# Patient Record
Sex: Female | Born: 1970 | Race: White | Hispanic: No | Marital: Married | Smoking: Former smoker
Health system: Southern US, Community
[De-identification: ages and names within clinical notes are randomized; demographics above are authoritative.]

## PROBLEM LIST (undated history)

## (undated) DIAGNOSIS — A048 Other specified bacterial intestinal infections: Secondary | ICD-10-CM

## (undated) DIAGNOSIS — M4800 Spinal stenosis, site unspecified: Secondary | ICD-10-CM

## (undated) DIAGNOSIS — J449 Chronic obstructive pulmonary disease, unspecified: Secondary | ICD-10-CM

## (undated) DIAGNOSIS — M5030 Other cervical disc degeneration, unspecified cervical region: Secondary | ICD-10-CM

## (undated) HISTORY — PX: TUBAL LIGATION: SHX77

---

## 1992-04-03 HISTORY — PX: TUBAL LIGATION: SHX77

## 2004-08-07 ENCOUNTER — Emergency Department: Payer: Self-pay | Admitting: Emergency Medicine

## 2005-05-12 ENCOUNTER — Emergency Department: Payer: Self-pay | Admitting: Internal Medicine

## 2007-04-11 ENCOUNTER — Emergency Department: Payer: Self-pay | Admitting: Emergency Medicine

## 2007-12-28 ENCOUNTER — Emergency Department: Payer: Self-pay | Admitting: Internal Medicine

## 2008-11-01 ENCOUNTER — Emergency Department: Payer: Self-pay | Admitting: Emergency Medicine

## 2010-02-22 ENCOUNTER — Emergency Department: Payer: Self-pay

## 2011-07-16 ENCOUNTER — Emergency Department: Payer: Self-pay | Admitting: Emergency Medicine

## 2011-07-17 ENCOUNTER — Emergency Department: Payer: Self-pay | Admitting: Emergency Medicine

## 2011-07-17 LAB — COMPREHENSIVE METABOLIC PANEL
Albumin: 4.2 g/dL (ref 3.4–5.0)
Anion Gap: 9 (ref 7–16)
Bilirubin,Total: 0.2 mg/dL (ref 0.2–1.0)
Calcium, Total: 9.4 mg/dL (ref 8.5–10.1)
Co2: 29 mmol/L (ref 21–32)
Creatinine: 0.79 mg/dL (ref 0.60–1.30)
EGFR (African American): >60
EGFR (Non-African Amer.): >60
Osmolality: 282 (ref 275–301)
SGOT(AST): 32 U/L (ref 15–37)
SGPT (ALT): 29 U/L
Total Protein: 7.6 g/dL (ref 6.4–8.2)

## 2011-07-17 LAB — URINALYSIS, COMPLETE
Bacteria: NONE SEEN
Bilirubin,UR: NEGATIVE
Glucose,UR: NEGATIVE mg/dL (ref 0–75)
Nitrite: NEGATIVE
Ph: 5 (ref 4.5–8.0)
Protein: NEGATIVE
Specific Gravity: 1.028 (ref 1.003–1.030)
Squamous Epithelial: 4
WBC UR: <1 /HPF (ref 0–5)

## 2011-07-17 LAB — CBC
HCT: 39.3 % (ref 35.0–47.0)
HGB: 12.8 g/dL (ref 12.0–16.0)
MCHC: 32.6 g/dL (ref 32.0–36.0)
MCV: 96 fL (ref 80–100)
Platelet: 292 10*3/uL (ref 150–440)
RBC: 4.09 10*6/uL (ref 3.80–5.20)

## 2011-11-21 ENCOUNTER — Emergency Department: Payer: Self-pay | Admitting: Emergency Medicine

## 2011-11-21 LAB — URINALYSIS, COMPLETE
Bilirubin,UR: NEGATIVE
Glucose,UR: NEGATIVE mg/dL (ref 0–75)
Granular Cast: 3
Hyaline Cast: 3
Ph: 5 (ref 4.5–8.0)
Squamous Epithelial: 15
Transitional Epi: <1
WBC UR: 9 /HPF (ref 0–5)

## 2011-11-21 LAB — COMPREHENSIVE METABOLIC PANEL
Alkaline Phosphatase: 58 U/L (ref 50–136)
BUN: 16 mg/dL (ref 7–18)
Bilirubin,Total: 0.3 mg/dL (ref 0.2–1.0)
Calcium, Total: 9 mg/dL (ref 8.5–10.1)
Chloride: 109 mmol/L — ABNORMAL HIGH (ref 98–107)
Co2: 25 mmol/L (ref 21–32)
EGFR (African American): >60
EGFR (Non-African Amer.): >60
Glucose: 100 mg/dL — ABNORMAL HIGH (ref 65–99)
Osmolality: 284 (ref 275–301)
Potassium: 3.5 mmol/L (ref 3.5–5.1)
SGPT (ALT): 20 U/L (ref 12–78)
Sodium: 142 mmol/L (ref 136–145)

## 2011-11-21 LAB — CBC
HGB: 13.6 g/dL (ref 12.0–16.0)
MCH: 32.3 pg (ref 26.0–34.0)
MCHC: 34 g/dL (ref 32.0–36.0)
RBC: 4.22 10*6/uL (ref 3.80–5.20)
WBC: 6.9 10*3/uL (ref 3.6–11.0)

## 2012-04-21 ENCOUNTER — Emergency Department: Payer: Self-pay | Admitting: Emergency Medicine

## 2013-09-07 ENCOUNTER — Emergency Department: Payer: Self-pay | Admitting: Emergency Medicine

## 2013-09-07 LAB — URINALYSIS, COMPLETE
Bilirubin,UR: NEGATIVE
GLUCOSE, UR: NEGATIVE mg/dL (ref 0–75)
Ketone: NEGATIVE
Leukocyte Esterase: NEGATIVE
NITRITE: NEGATIVE
PROTEIN: NEGATIVE
Ph: 6 (ref 4.5–8.0)
Specific Gravity: 1.024 (ref 1.003–1.030)
WBC UR: 3 /HPF (ref 0–5)

## 2013-09-07 LAB — CBC WITH DIFFERENTIAL/PLATELET
BASOS ABS: 0 10*3/uL (ref 0.0–0.1)
Basophil %: 0.4 %
Eosinophil #: 0.1 10*3/uL (ref 0.0–0.7)
Eosinophil %: 1.3 %
HCT: 39.4 % (ref 35.0–47.0)
HGB: 12.7 g/dL (ref 12.0–16.0)
LYMPHS ABS: 3.8 10*3/uL — AB (ref 1.0–3.6)
LYMPHS PCT: 40.6 %
MCH: 30.5 pg (ref 26.0–34.0)
MCHC: 32.2 g/dL (ref 32.0–36.0)
MCV: 95 fL (ref 80–100)
MONOS PCT: 7 %
Monocyte #: 0.7 x10 3/mm (ref 0.2–0.9)
NEUTROS ABS: 4.7 10*3/uL (ref 1.4–6.5)
NEUTROS PCT: 50.7 %
Platelet: 330 10*3/uL (ref 150–440)
RBC: 4.16 10*6/uL (ref 3.80–5.20)
RDW: 13.6 % (ref 11.5–14.5)
WBC: 9.4 10*3/uL (ref 3.6–11.0)

## 2013-09-07 LAB — LIPASE, BLOOD: LIPASE: 175 U/L (ref 73–393)

## 2013-09-08 LAB — COMPREHENSIVE METABOLIC PANEL
ALBUMIN: 3.7 g/dL (ref 3.4–5.0)
ALT: 24 U/L (ref 12–78)
Alkaline Phosphatase: 62 U/L
Anion Gap: 4 — ABNORMAL LOW (ref 7–16)
BUN: 16 mg/dL (ref 7–18)
Bilirubin,Total: 0.2 mg/dL (ref 0.2–1.0)
CHLORIDE: 108 mmol/L — AB (ref 98–107)
Calcium, Total: 9 mg/dL (ref 8.5–10.1)
Co2: 29 mmol/L (ref 21–32)
Creatinine: 1.01 mg/dL (ref 0.60–1.30)
GLUCOSE: 94 mg/dL (ref 65–99)
OSMOLALITY: 282 (ref 275–301)
Potassium: 3.9 mmol/L (ref 3.5–5.1)
SGOT(AST): 21 U/L (ref 15–37)
SODIUM: 141 mmol/L (ref 136–145)
Total Protein: 7.3 g/dL (ref 6.4–8.2)

## 2014-03-14 ENCOUNTER — Emergency Department: Payer: Self-pay | Admitting: Emergency Medicine

## 2014-07-29 ENCOUNTER — Emergency Department
Admit: 2014-07-29 | Disposition: A | Discharge status: Home / Self Care | Payer: Self-pay | Admitting: Emergency Medicine

## 2014-10-14 ENCOUNTER — Other Ambulatory Visit: Payer: Self-pay

## 2014-10-14 ENCOUNTER — Encounter: Payer: Self-pay | Admitting: Emergency Medicine

## 2014-10-14 ENCOUNTER — Emergency Department
Admission: EM | Admit: 2014-10-14 | Discharge: 2014-10-14 | Disposition: A | Discharge status: Home / Self Care | Payer: Self-pay | Attending: Emergency Medicine | Admitting: Emergency Medicine

## 2014-10-14 DIAGNOSIS — Z720 Tobacco use: Secondary | ICD-10-CM | POA: Insufficient documentation

## 2014-10-14 DIAGNOSIS — M5030 Other cervical disc degeneration, unspecified cervical region: Secondary | ICD-10-CM

## 2014-10-14 DIAGNOSIS — M5010 Cervical disc disorder with radiculopathy, unspecified cervical region: Secondary | ICD-10-CM | POA: Insufficient documentation

## 2014-10-14 DIAGNOSIS — Z880 Allergy status to penicillin: Secondary | ICD-10-CM | POA: Insufficient documentation

## 2014-10-14 DIAGNOSIS — M5412 Radiculopathy, cervical region: Secondary | ICD-10-CM

## 2014-10-14 HISTORY — DX: Spinal stenosis, site unspecified: M48.00

## 2014-10-14 LAB — BASIC METABOLIC PANEL
Anion gap: 8 (ref 5–15)
BUN: 17 mg/dL (ref 6–20)
CALCIUM: 9.1 mg/dL (ref 8.9–10.3)
CHLORIDE: 105 mmol/L (ref 101–111)
CO2: 26 mmol/L (ref 22–32)
CREATININE: 1.06 mg/dL — AB (ref 0.44–1.00)
GFR calc Af Amer: >60 mL/min (ref >60)
GLUCOSE: 82 mg/dL (ref 65–99)
POTASSIUM: 3.8 mmol/L (ref 3.5–5.1)
Sodium: 139 mmol/L (ref 135–145)

## 2014-10-14 LAB — CBC
HEMATOCRIT: 40.4 % (ref 35.0–47.0)
HEMOGLOBIN: 13.1 g/dL (ref 12.0–16.0)
MCH: 30.3 pg (ref 26.0–34.0)
MCHC: 32.5 g/dL (ref 32.0–36.0)
MCV: 93.3 fL (ref 80.0–100.0)
PLATELETS: 341 10*3/uL (ref 150–440)
RBC: 4.33 MIL/uL (ref 3.80–5.20)
RDW: 13.7 % (ref 11.5–14.5)
WBC: 7 10*3/uL (ref 3.6–11.0)

## 2014-10-14 LAB — TROPONIN I

## 2014-10-14 MED ORDER — PREDNISONE 10 MG PO TABS: ORAL_TABLET | ORAL | Status: DC

## 2014-10-14 NOTE — ED Provider Notes (Signed)
[facility] Emergency Department Provider Note  ____________________________________________  Time seen:  12:33 PM  I have reviewed the triage vital signs and the nursing notes.   HISTORY  Chief Complaint Numbness   HPI Hannah Shah is a 44 y.o. female is here today with complaint of left arm numbness for the last 7 days. She states that she has chronic neck issues and is aware that she has degenerative disc problems with her neck. She has not followed up with any specialist since being seen. She had x-rays done at [facility] several months ago and thought that they told her she had spinal stenosis. She states that present she does not have any pain medication. Currently her pain is a 9 out of 10.   Past Medical History  Diagnosis Date  . Spinal stenosis     There are no active problems to display for this patient.   Past Surgical History  Procedure Laterality Date  . Tubal ligation  1994    Current Outpatient Rx  Name  Route  Sig  Dispense  Refill  . predniSONE (DELTASONE) 10 MG tablet      Take 6 tablets  today, on day 2 take 5 tablets, day 3 take 4 tablets, day 4 take 3 tablets, day 5 take  2 tablets and 1 tablet the last day   21 tablet   0     Allergies Penicillins  No family history on file.  Social History History  Substance Use Topics  . Smoking status: Current Every Day Smoker -- 0.50 packs/day  . Smokeless tobacco: Not on file  . Alcohol Use: No    Review of Systems Constitutional: No fever/chills Eyes: No visual changes. ENT: No sore throat. Cardiovascular: Denies chest pain. Respiratory: Denies shortness of breath. Gastrointestinal: No abdominal pain.  No nausea, no vomiting.   Genitourinary: Negative for dysuria. Musculoskeletal: Negative for back pain. Skin: Negative for rash. Neurological: Negative for headaches, focal weakness. Positive numbness left arm.  10-point ROS otherwise  negative.  ____________________________________________   PHYSICAL EXAM:  VITAL SIGNS: ED Triage Vitals  Enc Vitals Group     BP 10/14/14 1052 114/68 mmHg     Pulse Rate 10/14/14 1052 102     Resp 10/14/14 1052 18     Temp 10/14/14 1052 98.3 F (36.8 C)     Temp Source 10/14/14 1052 Oral     SpO2 10/14/14 1052 98 %     Weight 10/14/14 1047 240 lb (108.863 kg)     Height 10/14/14 1047  (1.626 m)     Head Cir --      Peak Flow --      Pain Score 10/14/14 1047 9     Pain Loc --      Pain Edu? --      Excl. in GC? --     Constitutional: Alert and oriented. Well appearing and in no acute distress. Eyes: Conjunctivae are normal. PERRL. EOMI. Head: Atraumatic. Nose: No congestion/rhinnorhea. Neck: No stridor.  Tenderness on palpation of cervical spine and paravertebral muscles. Range of motion is restricted secondary to pain. No gross deformity was noted. Hematological/Lymphatic/Immunilogical: No cervical lymphadenopathy. Cardiovascular: Normal rate, regular rhythm. Grossly normal heart sounds.  Good peripheral circulation. Respiratory: Normal respiratory effort.  No retractions. Lungs CTAB. Gastrointestinal: Soft and nontender. No distention. Musculoskeletal: Left arm no gross deformity. Range of motion is within normal limits. Grip strength is slightly decreased in relationship to the right. Motor sensory function  intact. No lower extremity tenderness nor edema.  No joint effusions. Neurologic:  Normal speech and language. No gross focal neurologic deficits are appreciated. No gait instability. Skin:  Skin is warm, dry and intact. No rash noted. Psychiatric: Mood and affect are normal. Speech and behavior are normal.  ____________________________________________   LABS (all labs ordered are listed, but only abnormal results are displayed)  Labs Reviewed  BASIC METABOLIC PANEL - Abnormal; Notable for the following:    Creatinine, Ser 1.06 (*)    All other components  within normal limits  CBC  TROPONIN I     PROCEDURES  Procedure(s) performed: None  Critical Care performed: No  ____________________________________________   INITIAL IMPRESSION / ASSESSMENT AND PLAN / ED COURSE  Pertinent labs & imaging results that were available during my care of the patient were reviewed by me and considered in my medical decision making (see chart for details).  Patient was started on prednisone 60 a taper. She is to follow-up with Dr. Rosita KeaMenz for chronic neck pain with radiculopathy ____________________________________________   FINAL CLINICAL IMPRESSION(S) / ED DIAGNOSES  Final diagnoses:  DDD (degenerative disc disease), cervical  Chronic cervical radiculopathy      Tommi RumpsRhonda L Joud Pettinato, PA-C 10/14/14 1618  Sharyn CreamerMark Quale, MD 10/19/14 [phone]

## 2014-10-14 NOTE — ED Notes (Signed)
Developed pain and numbness to left side of chest and left arm about 7 days ago. Now having some pain in neck.denies nay injury

## 2014-10-14 NOTE — ED Notes (Signed)
States she has a hx of spinal stenosis and has had weakness /numbness in the past.

## 2014-10-14 NOTE — ED Provider Notes (Signed)
ED ECG REPORT I, QUALE, MARK, the attending physician, personally viewed and interpreted this ECG.  Date: 10/14/2014 EKG Time: 10:55 AM Rate: 103 Rhythm: Sinus tachycardia QRS Axis: normal Intervals: normal ST/T Wave abnormalities: normal Conduction Disutrbances: none Narrative Interpretation: Sinus tachycardia, no ischemic abnormalities.   Sharyn CreamerMark Quale, MD 10/14/14 [phone]

## 2014-12-25 ENCOUNTER — Emergency Department: Payer: Self-pay

## 2014-12-25 ENCOUNTER — Encounter: Payer: Self-pay | Admitting: Radiology

## 2014-12-25 ENCOUNTER — Emergency Department
Admission: EM | Admit: 2014-12-25 | Discharge: 2014-12-25 | Disposition: A | Discharge status: Home / Self Care | Payer: Self-pay | Attending: Emergency Medicine | Admitting: Emergency Medicine

## 2014-12-25 DIAGNOSIS — Z880 Allergy status to penicillin: Secondary | ICD-10-CM | POA: Insufficient documentation

## 2014-12-25 DIAGNOSIS — Z7952 Long term (current) use of systemic steroids: Secondary | ICD-10-CM | POA: Insufficient documentation

## 2014-12-25 DIAGNOSIS — R112 Nausea with vomiting, unspecified: Secondary | ICD-10-CM | POA: Insufficient documentation

## 2014-12-25 DIAGNOSIS — Z720 Tobacco use: Secondary | ICD-10-CM | POA: Insufficient documentation

## 2014-12-25 DIAGNOSIS — R109 Unspecified abdominal pain: Secondary | ICD-10-CM | POA: Insufficient documentation

## 2014-12-25 LAB — COMPREHENSIVE METABOLIC PANEL
ALK PHOS: 57 U/L (ref 38–126)
ALT: 21 U/L (ref 14–54)
ANION GAP: 3 — AB (ref 5–15)
AST: 24 U/L (ref 15–41)
Albumin: 4.2 g/dL (ref 3.5–5.0)
BUN: 12 mg/dL (ref 6–20)
CALCIUM: 9.2 mg/dL (ref 8.9–10.3)
CHLORIDE: 107 mmol/L (ref 101–111)
CO2: 30 mmol/L (ref 22–32)
Creatinine, Ser: 0.99 mg/dL (ref 0.44–1.00)
GFR calc non Af Amer: >60 mL/min (ref >60)
Glucose, Bld: 105 mg/dL — ABNORMAL HIGH (ref 65–99)
Potassium: 4 mmol/L (ref 3.5–5.1)
SODIUM: 140 mmol/L (ref 135–145)
Total Bilirubin: 0.6 mg/dL (ref 0.3–1.2)
Total Protein: 7.2 g/dL (ref 6.5–8.1)

## 2014-12-25 LAB — URINALYSIS COMPLETE WITH MICROSCOPIC (ARMC ONLY)
Bilirubin Urine: NEGATIVE
Glucose, UA: NEGATIVE mg/dL
Ketones, ur: NEGATIVE mg/dL
Leukocytes, UA: NEGATIVE
Nitrite: NEGATIVE
PH: 8 (ref 5.0–8.0)
PROTEIN: NEGATIVE mg/dL
SPECIFIC GRAVITY, URINE: 1.016 (ref 1.005–1.030)

## 2014-12-25 LAB — CBC
HCT: 41.8 % (ref 35.0–47.0)
HEMOGLOBIN: 13.7 g/dL (ref 12.0–16.0)
MCH: 30.3 pg (ref 26.0–34.0)
MCHC: 32.8 g/dL (ref 32.0–36.0)
MCV: 92.3 fL (ref 80.0–100.0)
Platelets: 316 10*3/uL (ref 150–440)
RBC: 4.53 MIL/uL (ref 3.80–5.20)
RDW: 14.2 % (ref 11.5–14.5)
WBC: 7.3 10*3/uL (ref 3.6–11.0)

## 2014-12-25 LAB — LIPASE, BLOOD: LIPASE: 29 U/L (ref 22–51)

## 2014-12-25 MED ORDER — SODIUM CHLORIDE 0.9 % IV BOLUS (SEPSIS)
1000.0000 mL | Freq: Once | INTRAVENOUS | Status: AC
  Administered 2014-12-25: 1000 mL via INTRAVENOUS

## 2014-12-25 MED ORDER — GI COCKTAIL ~~LOC~~
30.0000 mL | Freq: Once | ORAL | Status: AC
  Administered 2014-12-25: 30 mL via ORAL
  Filled 2014-12-25: qty 30

## 2014-12-25 MED ORDER — PROMETHAZINE HCL 12.5 MG PO TABS: 12.5000 mg | ORAL_TABLET | Freq: Four times a day (QID) | ORAL | Status: DC | PRN

## 2014-12-25 MED ORDER — IOHEXOL 300 MG/ML  SOLN
100.0000 mL | Freq: Once | INTRAMUSCULAR | Status: AC | PRN
  Administered 2014-12-25: 100 mL via INTRAVENOUS

## 2014-12-25 MED ORDER — ONDANSETRON HCL 4 MG/2ML IJ SOLN
4.0000 mg | Freq: Once | INTRAMUSCULAR | Status: AC
  Administered 2014-12-25: 4 mg via INTRAVENOUS
  Filled 2014-12-25: qty 2

## 2014-12-25 MED ORDER — ONDANSETRON HCL 4 MG/2ML IJ SOLN
4.0000 mg | Freq: Once | INTRAMUSCULAR | Status: AC
  Administered 2014-12-25: 4 mg via INTRAVENOUS

## 2014-12-25 MED ORDER — ONDANSETRON HCL 4 MG/2ML IJ SOLN
INTRAMUSCULAR | Status: AC
  Administered 2014-12-25: 4 mg via INTRAVENOUS
  Filled 2014-12-25: qty 2

## 2014-12-25 MED ORDER — PROMETHAZINE HCL 25 MG/ML IJ SOLN
INTRAMUSCULAR | Status: AC
  Administered 2014-12-25: 25 mg via INTRAVENOUS
  Filled 2014-12-25: qty 1

## 2014-12-25 MED ORDER — PROMETHAZINE HCL 25 MG/ML IJ SOLN
25.0000 mg | Freq: Once | INTRAMUSCULAR | Status: AC
  Administered 2014-12-25: 25 mg via INTRAVENOUS

## 2014-12-25 MED ORDER — IOHEXOL 240 MG/ML SOLN
25.0000 mL | Freq: Once | INTRAMUSCULAR | Status: DC | PRN
  Administered 2014-12-25: 25 mL via INTRAVENOUS
  Filled 2014-12-25: qty 50

## 2014-12-25 MED ORDER — PROMETHAZINE HCL 25 MG RE SUPP: 25.0000 mg | Freq: Four times a day (QID) | RECTAL | Status: DC | PRN

## 2014-12-25 MED ORDER — MORPHINE SULFATE (PF) 4 MG/ML IV SOLN
4.0000 mg | Freq: Once | INTRAVENOUS | Status: AC
  Administered 2014-12-25: 4 mg via INTRAVENOUS
  Filled 2014-12-25: qty 1

## 2014-12-25 NOTE — Discharge Instructions (Signed)
Please seek medical attention for any high fevers, chest pain, shortness of breath, change in behavior, persistent vomiting, bloody stool or any other new or concerning symptoms. ° ° °Nausea and Vomiting °Nausea is a sick feeling that often comes before throwing up (vomiting). Vomiting is a reflex where stomach contents come out of your mouth. Vomiting can cause severe loss of body fluids (dehydration). Children and elderly adults can become dehydrated quickly, especially if they also have diarrhea. Nausea and vomiting are symptoms of a condition or disease. It is important to find the cause of your symptoms. °CAUSES  °· Direct irritation of the stomach lining. This irritation can result from increased acid production (gastroesophageal reflux disease), infection, food poisoning, taking certain medicines (such as nonsteroidal anti-inflammatory drugs), alcohol use, or tobacco use. °· Signals from the brain. These signals could be caused by a headache, heat exposure, an inner ear disturbance, increased pressure in the brain from injury, infection, a tumor, or a concussion, pain, emotional stimulus, or metabolic problems. °· An obstruction in the gastrointestinal tract (bowel obstruction). °· Illnesses such as diabetes, hepatitis, gallbladder problems, appendicitis, kidney problems, cancer, sepsis, atypical symptoms of a heart attack, or eating disorders. °· Medical treatments such as chemotherapy and radiation. °· Receiving medicine that makes you sleep (general anesthetic) during surgery. °DIAGNOSIS °Your caregiver may ask for tests to be done if the problems do not improve after a few days. Tests may also be done if symptoms are severe or if the reason for the nausea and vomiting is not clear. Tests may include: °· Urine tests. °· Blood tests. °· Stool tests. °· Cultures (to look for evidence of infection). °· X-rays or other imaging studies. °Test results can help your caregiver make decisions about treatment or the  need for additional tests. °TREATMENT °You need to stay well hydrated. Drink frequently but in small amounts. You may wish to drink water, sports drinks, clear broth, or eat frozen ice pops or gelatin dessert to help stay hydrated. When you eat, eating slowly may help prevent nausea. There are also some antinausea medicines that may help prevent nausea. °HOME CARE INSTRUCTIONS  °· Take all medicine as directed by your caregiver. °· If you do not have an appetite, do not force yourself to eat. However, you must continue to drink fluids. °· If you have an appetite, eat a normal diet unless your caregiver tells you differently. °¨ Eat a variety of complex carbohydrates (rice, wheat, potatoes, bread), lean meats, yogurt, fruits, and vegetables. °¨ Avoid high-fat foods because they are more difficult to digest. °· Drink enough water and fluids to keep your urine clear or pale yellow. °· If you are dehydrated, ask your caregiver for specific rehydration instructions. Signs of dehydration may include: °¨ Severe thirst. °¨ Dry lips and mouth. °¨ Dizziness. °¨ Dark urine. °¨ Decreasing urine frequency and amount. °¨ Confusion. °¨ Rapid breathing or pulse. °SEEK IMMEDIATE MEDICAL CARE IF:  °· You have blood or brown flecks (like coffee grounds) in your vomit. °· You have black or bloody stools. °· You have a severe headache or stiff neck. °· You are confused. °· You have severe abdominal pain. °· You have chest pain or trouble breathing. °· You do not urinate at least once every 8 hours. °· You develop cold or clammy skin. °· You continue to vomit for longer than 24 to 48 hours. °· You have a fever. °MAKE SURE YOU:  °· Understand these instructions. °· Will watch your condition. °· Will get help right away   if you are not doing well or get worse. °Document Released: 03/20/2005 Document Revised: 06/12/2011 Document Reviewed: 08/17/2010 °[employer]® Patient Information ©2015 [employer]. This information is not intended to  replace advice given to you by your health care provider. Make sure you discuss any questions you have with your health care provider. ° °

## 2014-12-25 NOTE — ED Notes (Signed)
Pt complaining of vomiting x2 weeks, LLQ and LUQ fluttering feeling to abd , LMP July , premenopausal, increased frequency of urination with no change in oral intake.

## 2014-12-25 NOTE — ED Notes (Signed)
Vomiting for the past 2 weeks, pain in between her shoulders and more on the left and lower abd pain, pt also states that she has a tingling numbness to bilat hands and feet.

## 2014-12-25 NOTE — ED Provider Notes (Signed)
[facility] Emergency Department Provider Note    ____________________________________________  Time seen: 1700  I have reviewed the triage vital signs and the nursing notes.   HISTORY  Chief Complaint Emesis   History limited by: Not Limited   HPI Hannah Shah is a 44 y.o. female who presents to the emergency department today with nausea vomiting and left-sided abdominal pain. The patient states the symptoms have been going on for 2 weeks. She states that they have been constant. She describes the pain as being moderate to severe. It is located in the left side of the abdomen. She has had associated nausea and vomiting. She states she has been unable to keep down anything by mouth. She denies having similar symptoms in the past. She has not had any fevers.  Past Medical History  Diagnosis Date  . Spinal stenosis     There are no active problems to display for this patient.   Past Surgical History  Procedure Laterality Date  . Tubal ligation  1994    Current Outpatient Rx  Name  Route  Sig  Dispense  Refill  . predniSONE (DELTASONE) 10 MG tablet      Take 6 tablets  today, on day 2 take 5 tablets, day 3 take 4 tablets, day 4 take 3 tablets, day 5 take  2 tablets and 1 tablet the last day   21 tablet   0     Allergies Penicillins  No family history on file.  Social History Social History  Substance Use Topics  . Smoking status: Current Every Day Smoker -- 0.50 packs/day  . Smokeless tobacco: Not on file  . Alcohol Use: No    Review of Systems  Constitutional: Negative for fever. Cardiovascular: Negative for chest pain. Respiratory: Negative for shortness of breath. Gastrointestinal: Positive for left-sided abdominal pain and vomiting Genitourinary: Negative for dysuria. Musculoskeletal: Negative for back pain. Skin: Negative for rash. Neurological: Negative for headaches, focal weakness or numbness.  10-point ROS  otherwise negative.  ____________________________________________   PHYSICAL EXAM:  VITAL SIGNS: ED Triage Vitals  Enc Vitals Group     BP 12/25/14 1520 123/66 mmHg     Pulse Rate 12/25/14 1229 83     Resp 12/25/14 1229 18     Temp 12/25/14 1229 98.7 F (37.1 C)     Temp Source 12/25/14 1229 Oral     SpO2 12/25/14 1229 98 %     Weight 12/25/14 1229 240 lb (108.863 kg)     Height 12/25/14 1229  (1.6 m)   Constitutional: Alert and oriented. Appears uncomfortable, nauseous Eyes: Conjunctivae are normal. PERRL. Normal extraocular movements. ENT   Head: Normocephalic and atraumatic.   Nose: No congestion/rhinnorhea.   Mouth/Throat: Mucous membranes are moist.   Neck: No stridor. Hematological/Lymphatic/Immunilogical: No cervical lymphadenopathy. Cardiovascular: Normal rate, regular rhythm.  No murmurs, rubs, or gallops. Respiratory: Normal respiratory effort without tachypnea nor retractions. Breath sounds are clear and equal bilaterally. No wheezes/rales/rhonchi. Gastrointestinal: Soft and minimally tender to the left side. No rebound. No guarding. No distention. Mild left-sided CVA tenderness Genitourinary: Deferred Musculoskeletal: Normal range of motion in all extremities. No joint effusions.  No lower extremity tenderness nor edema. Neurologic:  Normal speech and language. No gross focal neurologic deficits are appreciated. Speech is normal.  Skin:  Skin is warm, dry and intact. No rash noted. Psychiatric: Mood and affect are normal. Speech and behavior are normal. Patient exhibits appropriate insight and judgment.  ____________________________________________  LABS (pertinent positives/negatives)  Labs Reviewed  COMPREHENSIVE METABOLIC PANEL - Abnormal; Notable for the following:    Glucose, Bld 105 (*)    Anion gap 3 (*)    All other components within normal limits  URINALYSIS COMPLETEWITH MICROSCOPIC ([facility] ONLY) - Abnormal; Notable for the  following:    Color, Urine YELLOW (*)    APPearance CLEAR (*)    Hgb urine dipstick 1+ (*)    Bacteria, UA RARE (*)    Squamous Epithelial / LPF 0-5 (*)    All other components within normal limits  LIPASE, BLOOD  CBC  POC URINE PREG, ED     ____________________________________________   EKG  None  ____________________________________________    RADIOLOGY  Ct abd/pel  IMPRESSION: Normal study.  ____________________________________________   PROCEDURES  Procedure(s) performed: None  Critical Care performed: No  ____________________________________________   INITIAL IMPRESSION / ASSESSMENT AND PLAN / ED COURSE  Pertinent labs & imaging results that were available during my care of the patient were reviewed by me and considered in my medical decision making (see chart for details).  Patient presented to the emergency department today because of concerns for left-sided abdominal pain nausea and vomiting for 2 weeks. On exam patient did have some mild left-sided tenderness. Blood work without any concerning findings. Given the tenderness I did have concerns for possible diverticulitis, pyelonephritis and a CT scan was ordered. CT scan without any obvious etiology. Patient did feel better after Phenergan. Discussed with patient that CT scan blood work and urine without any obvious cause of the pain. Advised patient follow-up with GI.  ____________________________________________   FINAL CLINICAL IMPRESSION(S) / ED DIAGNOSES  Final diagnoses:  Nausea and vomiting, vomiting of unspecified type     Phineas Semen, MD 12/25/14 2114

## 2015-03-01 ENCOUNTER — Encounter: Payer: Self-pay | Admitting: Emergency Medicine

## 2015-03-01 ENCOUNTER — Emergency Department: Payer: Self-pay

## 2015-03-01 ENCOUNTER — Emergency Department
Admission: EM | Admit: 2015-03-01 | Discharge: 2015-03-01 | Disposition: A | Discharge status: Home / Self Care | Payer: Self-pay | Attending: Emergency Medicine | Admitting: Emergency Medicine

## 2015-03-01 DIAGNOSIS — F17200 Nicotine dependence, unspecified, uncomplicated: Secondary | ICD-10-CM | POA: Insufficient documentation

## 2015-03-01 DIAGNOSIS — Z7952 Long term (current) use of systemic steroids: Secondary | ICD-10-CM | POA: Insufficient documentation

## 2015-03-01 DIAGNOSIS — Z880 Allergy status to penicillin: Secondary | ICD-10-CM | POA: Insufficient documentation

## 2015-03-01 DIAGNOSIS — R202 Paresthesia of skin: Secondary | ICD-10-CM | POA: Insufficient documentation

## 2015-03-01 DIAGNOSIS — M47892 Other spondylosis, cervical region: Secondary | ICD-10-CM | POA: Insufficient documentation

## 2015-03-01 DIAGNOSIS — M47812 Spondylosis without myelopathy or radiculopathy, cervical region: Secondary | ICD-10-CM

## 2015-03-01 DIAGNOSIS — J441 Chronic obstructive pulmonary disease with (acute) exacerbation: Secondary | ICD-10-CM | POA: Insufficient documentation

## 2015-03-01 HISTORY — DX: Other cervical disc degeneration, unspecified cervical region: M50.30

## 2015-03-01 HISTORY — DX: Chronic obstructive pulmonary disease, unspecified: J44.9

## 2015-03-01 MED ORDER — AZITHROMYCIN 250 MG PO TABS: ORAL_TABLET | ORAL | Status: DC

## 2015-03-01 MED ORDER — GUAIFENESIN-CODEINE 100-10 MG/5ML PO SOLN: 10.0000 mL | ORAL | Status: DC | PRN

## 2015-03-01 MED ORDER — MELOXICAM 15 MG PO TABS: 15.0000 mg | ORAL_TABLET | Freq: Every day | ORAL | Status: DC

## 2015-03-01 NOTE — Discharge Instructions (Signed)

## 2015-03-01 NOTE — ED Provider Notes (Signed)
[facility] Emergency Department Provider Note  ____________________________________________  Time seen: Approximately 11:57 AM  I have reviewed the triage vital signs and the nursing notes.   HISTORY  Chief Complaint Numbness  HPI Hannah Shah is a 44 y.o. female who presents for evaluation of numbness to both hands upon awakening for the last 3-4 days. Past medical history significant for degenerative disc disease. In addition patient reports having a cough for about a weeks with some discomfort in her chest and back.   Past Medical History  Diagnosis Date  . Spinal stenosis   . DDD (degenerative disc disease), cervical   . COPD (chronic obstructive pulmonary disease) (HCC)     There are no active problems to display for this patient.   Past Surgical History  Procedure Laterality Date  . Tubal ligation  1994    Current Outpatient Rx  Name  Route  Sig  Dispense  Refill  . azithromycin (ZITHROMAX Z-PAK) 250 MG tablet      Take 2 tablets (500 mg) on  Day 1,  followed by 1 tablet (250 mg) once daily on Days 2 through 5.   6 each   0   . guaiFENesin-codeine 100-10 MG/5ML syrup   Oral   Take 10 mLs by mouth every 4 (four) hours as needed for cough.   180 mL   0   . meloxicam (MOBIC) 15 MG tablet   Oral   Take 1 tablet (15 mg total) by mouth daily.   30 tablet   0   . predniSONE (DELTASONE) 10 MG tablet      Take 6 tablets  today, on day 2 take 5 tablets, day 3 take 4 tablets, day 4 take 3 tablets, day 5 take  2 tablets and 1 tablet the last day   21 tablet   0   . promethazine (PHENERGAN) 12.5 MG tablet   Oral   Take 1 tablet (12.5 mg total) by mouth every 6 (six) hours as needed for nausea or vomiting.   30 tablet   0   . promethazine (PHENERGAN) 25 MG suppository   Rectal   Place 1 suppository (25 mg total) rectally every 6 (six) hours as needed for nausea.   12 suppository   1     Allergies Penicillins  No family  history on file.  Social History Social History  Substance Use Topics  . Smoking status: Current Every Day Smoker -- 0.50 packs/day  . Smokeless tobacco: None  . Alcohol Use: No    Review of Systems Constitutional: No fever/chills Eyes: No visual changes. ENT: No sore throat. Cardiovascular: Denies chest pain. Respiratory: Denies shortness of breath. Positive for cough and chest congestion. Gastrointestinal: No abdominal pain.  No nausea, no vomiting.  No diarrhea.  No constipation. Genitourinary: Negative for dysuria. Musculoskeletal: Negative for back pain. Positive for lateral hand numbness and tingling. Skin: Negative for rash. Neurological: Positive for bilateral hand numbness and tingling.  10-point ROS otherwise negative.  ____________________________________________   PHYSICAL EXAM:  VITAL SIGNS: ED Triage Vitals  Enc Vitals Group     BP 03/01/15 1118 131/83 mmHg     Pulse Rate 03/01/15 1118 77     Resp 03/01/15 1118 20     Temp 03/01/15 1118 97.8 F (36.6 C)     Temp Source 03/01/15 1118 Oral     SpO2 03/01/15 1118 100 %     Weight 03/01/15 1118 235 lb (106.595 kg)  Height 03/01/15 1118 5\' 4"  (1.626 m)     Head Cir --      Peak Flow --      Pain Score 03/01/15 1119 8     Pain Loc --      Pain Edu? --      Excl. in GC? --     Constitutional: Alert and oriented. Well appearing and in no acute distress. Eyes: Conjunctivae are normal. PERRL. EOMI. Head: Atraumatic. Nose: No congestion/rhinnorhea. Mouth/Throat: Mucous membranes are moist.  Oropharynx non-erythematous. Neck: No stridor.  No cervical spinal tenderness to palpation Cardiovascular: Normal rate, regular rhythm. Grossly normal heart sounds.  Good peripheral circulation. Respiratory: Normal respiratory effort.  No retractions. Lungs CTAB. Gastrointestinal: Soft and nontender. No distention. No abdominal bruits. No CVA tenderness. Musculoskeletal: No lower extremity tenderness nor edema.  No  joint effusions. Neurologic:  Normal speech and language. No gross focal neurologic deficits are appreciated. No gait instability. Tinel's negative bilaterally. Decreased strength noted bilaterally. Full range of motion both shoulders. Skin:  Skin is warm, dry and intact. No rash noted. Psychiatric: Mood and affect are normal. Speech and behavior are normal.  ____________________________________________   LABS (all labs ordered are listed, but only abnormal results are displayed)  Labs Reviewed - No data to display ____________________________________________  RADIOLOGY  Chest x-ray negative  FINDINGS: There is straightening of the normal cervical lordosis without subluxation or fracture. Uncovertebral hypertrophy in the mid cervical spine. Mild endplate degenerative changes, loss of disc space height and mild left neural foraminal narrowing at C4-5. Visualized lung apices are clear. Soft tissues are unremarkable.  IMPRESSION: Straightening of the normal cervical lordosis with mild degenerative disc disease, worst at C4-5. ____________________________________________   PROCEDURES  Procedure(s) performed: None  Critical Care performed: No  ____________________________________________   INITIAL IMPRESSION / ASSESSMENT AND PLAN / ED COURSE  Pertinent labs & imaging results that were available during my care of the patient were reviewed by me and considered in my medical decision making (see chart for details).  Acute exacerbation of COPD. Degenerative disc disease of the cervical spine. Reassurance provided Rx given for Z-Pak, Robitussin-AC, and meloxicam 15 mg daily. Patient follow-up or establish a local provider for continued care. ____________________________________________   FINAL CLINICAL IMPRESSION(S) / ED DIAGNOSES  Final diagnoses:  COPD exacerbation (HCC)  DJD (degenerative joint disease) of cervical spine      Evangeline DakinCharles M Beers, PA-C 03/01/15  1541  Myrna Blazeravid Matthew Schaevitz, MD 03/01/15 [phone]

## 2015-03-01 NOTE — ED Notes (Addendum)
States she has numbness to hands upon awaking for the past 3-4 days. Hx of degenerative disc  Also cough for about 1 week with some discomfort in chest and mid back on the right

## 2015-04-26 ENCOUNTER — Emergency Department
Admission: EM | Admit: 2015-04-26 | Discharge: 2015-04-26 | Disposition: A | Discharge status: Home / Self Care | Payer: Self-pay | Attending: Emergency Medicine | Admitting: Emergency Medicine

## 2015-04-26 ENCOUNTER — Encounter: Payer: Self-pay | Admitting: Emergency Medicine

## 2015-04-26 DIAGNOSIS — J449 Chronic obstructive pulmonary disease, unspecified: Secondary | ICD-10-CM | POA: Insufficient documentation

## 2015-04-26 DIAGNOSIS — H65111 Acute and subacute allergic otitis media (mucoid) (sanguinous) (serous), right ear: Secondary | ICD-10-CM

## 2015-04-26 DIAGNOSIS — H6591 Unspecified nonsuppurative otitis media, right ear: Secondary | ICD-10-CM | POA: Insufficient documentation

## 2015-04-26 DIAGNOSIS — Z880 Allergy status to penicillin: Secondary | ICD-10-CM | POA: Insufficient documentation

## 2015-04-26 DIAGNOSIS — Z79899 Other long term (current) drug therapy: Secondary | ICD-10-CM | POA: Insufficient documentation

## 2015-04-26 DIAGNOSIS — H6091 Unspecified otitis externa, right ear: Secondary | ICD-10-CM | POA: Insufficient documentation

## 2015-04-26 DIAGNOSIS — J0100 Acute maxillary sinusitis, unspecified: Secondary | ICD-10-CM | POA: Insufficient documentation

## 2015-04-26 DIAGNOSIS — Z791 Long term (current) use of non-steroidal anti-inflammatories (NSAID): Secondary | ICD-10-CM | POA: Insufficient documentation

## 2015-04-26 DIAGNOSIS — F17200 Nicotine dependence, unspecified, uncomplicated: Secondary | ICD-10-CM | POA: Insufficient documentation

## 2015-04-26 MED ORDER — DOXYCYCLINE HYCLATE 100 MG PO TABS
100.0000 mg | ORAL_TABLET | Freq: Two times a day (BID) | ORAL | Status: DC
Start: 2015-04-26 — End: 2016-06-17

## 2015-04-26 MED ORDER — FLUTICASONE PROPIONATE 50 MCG/ACT NA SUSP: 1.0000 | Freq: Two times a day (BID) | NASAL | Status: DC

## 2015-04-26 MED ORDER — NEOMYCIN-POLYMYXIN-HC 3.5-10000-1 OT SOLN: 3.0000 [drp] | Freq: Three times a day (TID) | OTIC | Status: AC

## 2015-04-26 MED ORDER — CETIRIZINE HCL 10 MG PO TABS: 10.0000 mg | ORAL_TABLET | Freq: Every day | ORAL | Status: DC

## 2015-04-26 NOTE — ED Notes (Signed)
Pt states has had nasal congestion and drainage for three days.Denies fever.

## 2015-04-26 NOTE — Discharge Instructions (Signed)
Ear Drops, Adult °You have been diagnosed with a condition requiring you to put drops of medicine into your outer ear. °HOME CARE INSTRUCTIONS  °· Put drops in the affected ear as instructed. After putting the drops in, you will need to lie down with the affected ear facing up for ten minutes so the drops will remain in the ear canal and run down and fill the canal. Continue using the ear drops for as long as directed by your health care provider. °· Prior to getting up, put a cotton ball gently in your ear canal. Leave enough of the cotton ball out so it can be easily removed. Do not attempt to push this down into the canal with a cotton-tipped swab or other instrument. °· Do not irrigate or wash out your ears if you have had a perforated eardrum or mastoid surgery, or unless instructed to do so by your health care provider. °· Keep appointments with your health care provider as instructed. °· Finish all medicine, or use for the length of time prescribed by your health care provider. Continue the drops even if your problem seems to be doing well after a couple days, or continue as instructed. °SEEK MEDICAL CARE IF: °· You become worse or develop increasing pain. °· You notice any unusual drainage from your ear (particularly if the drainage has a bad smell). °· You develop hearing difficulties. °· You experience a serious form of dizziness in which you feel as if the room is spinning, and you feel nauseated (vertigo). °· The outside of your ear becomes red or swollen or both. This may be a sign of an allergic reaction. °MAKE SURE YOU:  °· Understand these instructions. °· Will watch your condition. °· Will get help right away if you are not doing well or get worse. °  °This information is not intended to replace advice given to you by your health care provider. Make sure you discuss any questions you have with your health care provider. °  °Document Released: 03/14/2001 Document Revised: 04/10/2014 Document  Reviewed: 10/15/2012 °Elsevier Interactive Patient Education ©2016 [employer] ° °Otitis Externa °Otitis externa is a bacterial or fungal infection of the outer ear canal. This is the area from the eardrum to the outside of the ear. Otitis externa is sometimes called "swimmer's ear." °CAUSES  °Possible causes of infection include: °· Swimming in dirty water. °· Moisture remaining in the ear after swimming or bathing. °· Mild injury (trauma) to the ear. °· Objects stuck in the ear (foreign body). °· Cuts or scrapes (abrasions) on the outside of the ear. °SIGNS AND SYMPTOMS  °The first symptom of infection is often itching in the ear canal. Later signs and symptoms may include swelling and redness of the ear canal, ear pain, and yellowish-white fluid (pus) coming from the ear. The ear pain may be worse when pulling on the earlobe. °DIAGNOSIS  °Your health care provider will perform a physical exam. A sample of fluid may be taken from the ear and examined for bacteria or fungi. °TREATMENT  °Antibiotic ear drops are often given for 10 to 14 days. Treatment may also include pain medicine or corticosteroids to reduce itching and swelling. °HOME CARE INSTRUCTIONS  °· Apply antibiotic ear drops to the ear canal as prescribed by your health care provider. °· Take medicines only as directed by your health care provider. °· If you have diabetes, follow any additional treatment instructions from your health care provider. °· Keep all follow-up visits   as directed by your health care provider. °PREVENTION  °· Keep your ear dry. Use the corner of a towel to absorb water out of the ear canal after swimming or bathing. °· Avoid scratching or putting objects inside your ear. This can damage the ear canal or remove the protective wax that lines the canal. This makes it easier for bacteria and fungi to grow. °· Avoid swimming in lakes, polluted water, or poorly chlorinated pools. °· You may use ear drops made of rubbing alcohol and  vinegar after swimming. Combine equal parts of white vinegar and alcohol in a bottle. Put 3 or 4 drops into each ear after swimming. °SEEK MEDICAL CARE IF:  °· You have a fever. °· Your ear is still red, swollen, painful, or draining pus after 3 days. °· Your redness, swelling, or pain gets worse. °· You have a severe headache. °· You have redness, swelling, pain, or tenderness in the area behind your ear. °MAKE SURE YOU:  °· Understand these instructions. °· Will watch your condition. °· Will get help right away if you are not doing well or get worse. °  °This information is not intended to replace advice given to you by your health care provider. Make sure you discuss any questions you have with your health care provider. °  °Document Released: 03/20/2005 Document Revised: 04/10/2014 Document Reviewed: 04/06/2011 °Elsevier Interactive Patient Education ©2016 [employer] ° °Otitis Media, Adult °Otitis media is redness, soreness, and inflammation of the middle ear. Otitis media may be caused by allergies or, most commonly, by infection. Often it occurs as a complication of the common cold. °SIGNS AND SYMPTOMS °Symptoms of otitis media may include: °· Earache. °· Fever. °· Ringing in your ear. °· Headache. °· Leakage of fluid from the ear. °DIAGNOSIS °To diagnose otitis media, your health care provider will examine your ear with an otoscope. This is an instrument that allows your health care provider to see into your ear in order to examine your eardrum. Your health care provider also will ask you questions about your symptoms. °TREATMENT  °Typically, otitis media resolves on its own within 3-5 days. Your health care provider may prescribe medicine to ease your symptoms of pain. If otitis media does not resolve within 5 days or is recurrent, your health care provider may prescribe antibiotic medicines if he or she suspects that a bacterial infection is the cause. °HOME CARE INSTRUCTIONS  °· If you were prescribed  an antibiotic medicine, finish it all even if you start to feel better. °· Take medicines only as directed by your health care provider. °· Keep all follow-up visits as directed by your health care provider. °SEEK MEDICAL CARE IF: °· You have otitis media only in one ear, or bleeding from your nose, or both. °· You notice a lump on your neck. °· You are not getting better in 3-5 days. °· You feel worse instead of better. °SEEK IMMEDIATE MEDICAL CARE IF:  °· You have pain that is not controlled with medicine. °· You have swelling, redness, or pain around your ear or stiffness in your neck. °· You notice that part of your face is paralyzed. °· You notice that the bone behind your ear (mastoid) is tender when you touch it. °MAKE SURE YOU:  °· Understand these instructions. °· Will watch your condition. °· Will get help right away if you are not doing well or get worse. °  °This information is not intended to replace advice given to you   by your health care provider. Make sure you discuss any questions you have with your health care provider. °  °Document Released: 12/24/2003 Document Revised: 04/10/2014 Document Reviewed: 10/15/2012 °Elsevier Interactive Patient Education ©2016 [employer] ° °Sinusitis, Adult °Sinusitis is redness, soreness, and inflammation of the paranasal sinuses. Paranasal sinuses are air pockets within the bones of your face. They are located beneath your eyes, in the middle of your forehead, and above your eyes. In healthy paranasal sinuses, mucus is able to drain out, and air is able to circulate through them by way of your nose. However, when your paranasal sinuses are inflamed, mucus and air can become trapped. This can allow bacteria and other germs to grow and cause infection. °Sinusitis can develop quickly and last only a short time (acute) or continue over a long period (chronic). Sinusitis that lasts for more than 12 weeks is considered chronic. °CAUSES °Causes of sinusitis  include: °· Allergies. °· Structural abnormalities, such as displacement of the cartilage that separates your nostrils (deviated septum), which can decrease the air flow through your nose and sinuses and affect sinus drainage. °· Functional abnormalities, such as when the small hairs (cilia) that line your sinuses and help remove mucus do not work properly or are not present. °SIGNS AND SYMPTOMS °Symptoms of acute and chronic sinusitis are the same. The primary symptoms are pain and pressure around the affected sinuses. Other symptoms include: °· Upper toothache. °· Earache. °· Headache. °· Bad breath. °· Decreased sense of smell and taste. °· A cough, which worsens when you are lying flat. °· Fatigue. °· Fever. °· Thick drainage from your nose, which often is green and may contain pus (purulent). °· Swelling and warmth over the affected sinuses. °DIAGNOSIS °Your health care provider will perform a physical exam. During your exam, your health care provider may perform any of the following to help determine if you have acute sinusitis or chronic sinusitis: °· Look in your nose for signs of abnormal growths in your nostrils (nasal polyps). °· Tap over the affected sinus to check for signs of infection. °· View the inside of your sinuses using an imaging device that has a light attached (endoscope). °If your health care provider suspects that you have chronic sinusitis, one or more of the following tests may be recommended: °· Allergy tests. °· Nasal culture. A sample of mucus is taken from your nose, sent to a lab, and screened for bacteria. °· Nasal cytology. A sample of mucus is taken from your nose and examined by your health care provider to determine if your sinusitis is related to an allergy. °TREATMENT °Most cases of acute sinusitis are related to a viral infection and will resolve on their own within 10 days. Sometimes, medicines are prescribed to help relieve symptoms of both acute and chronic sinusitis.  These may include pain medicines, decongestants, nasal steroid sprays, or saline sprays. °However, for sinusitis related to a bacterial infection, your health care provider will prescribe antibiotic medicines. These are medicines that will help kill the bacteria causing the infection. °Rarely, sinusitis is caused by a fungal infection. In these cases, your health care provider will prescribe antifungal medicine. °For some cases of chronic sinusitis, surgery is needed. Generally, these are cases in which sinusitis recurs more than 3 times per year, despite other treatments. °HOME CARE INSTRUCTIONS °· Drink plenty of water. Water helps thin the mucus so your sinuses can drain more easily. °· Use a humidifier. °· Inhale steam 3-4 times a day (  for example, sit in the bathroom with the shower running). °· Apply a warm, moist washcloth to your face 3-4 times a day, or as directed by your health care provider. °· Use saline nasal sprays to help moisten and clean your sinuses. °· Take medicines only as directed by your health care provider. °· If you were prescribed either an antibiotic or antifungal medicine, finish it all even if you start to feel better. °SEEK IMMEDIATE MEDICAL CARE IF: °· You have increasing pain or severe headaches. °· You have nausea, vomiting, or drowsiness. °· You have swelling around your face. °· You have vision problems. °· You have a stiff neck. °· You have difficulty breathing. °  °This information is not intended to replace advice given to you by your health care provider. Make sure you discuss any questions you have with your health care provider. °  °Document Released: 03/20/2005 Document Revised: 04/10/2014 Document Reviewed: 04/04/2011 °Elsevier Interactive Patient Education ©2016 [employer]. ° °

## 2015-04-26 NOTE — ED Notes (Signed)
Presents with right pain for 7 days   Cough for 3 days

## 2015-04-26 NOTE — ED Provider Notes (Signed)
[facility] Emergency Department Provider Note  ____________________________________________  Time seen: Approximately 6:51 PM  I have reviewed the triage vital signs and the nursing notes.   HISTORY  Chief Complaint Otalgia    HPI Hannah Shah is a 45 y.o. female who presents the emergency department complaining of right ear pain, nasal congestion, scratchy throat, and cough. She states that her symptoms began 7 days ago with ear pain and she began with nasal congestion and cough 3 days ago. She states that it is painful to palpation of the outer ear as well as feeling like it's a deep inner ear pain. She has tried some over-the-counter medications with no relief. She denies any fevers or chills, difficulty breathing or swallowing, chest pain, shortness breath, abdominal pain, nausea or vomiting.   Past Medical History  Diagnosis Date  . Spinal stenosis   . DDD (degenerative disc disease), cervical   . COPD (chronic obstructive pulmonary disease) (HCC)     There are no active problems to display for this patient.   Past Surgical History  Procedure Laterality Date  . Tubal ligation  1994    Current Outpatient Rx  Name  Route  Sig  Dispense  Refill  . azithromycin (ZITHROMAX Z-PAK) 250 MG tablet      Take 2 tablets (500 mg) on  Day 1,  followed by 1 tablet (250 mg) once daily on Days 2 through 5.   6 each   0   . cetirizine (ZYRTEC) 10 MG tablet   Oral   Take 1 tablet (10 mg total) by mouth daily.   30 tablet   0   . doxycycline (VIBRA-TABS) 100 MG tablet   Oral   Take 1 tablet (100 mg total) by mouth 2 (two) times daily.   14 tablet   0   . fluticasone (FLONASE) 50 MCG/ACT nasal spray   Each Nare   Place 1 spray into both nostrils 2 (two) times daily.   16 g   0   . guaiFENesin-codeine 100-10 MG/5ML syrup   Oral   Take 10 mLs by mouth every 4 (four) hours as needed for cough.   180 mL   0   . meloxicam (MOBIC) 15 MG  tablet   Oral   Take 1 tablet (15 mg total) by mouth daily.   30 tablet   0   . neomycin-polymyxin-hydrocortisone (CORTISPORIN) otic solution   Right Ear   Place 3 drops into the right ear 3 (three) times daily.   10 mL   0   . predniSONE (DELTASONE) 10 MG tablet      Take 6 tablets  today, on day 2 take 5 tablets, day 3 take 4 tablets, day 4 take 3 tablets, day 5 take  2 tablets and 1 tablet the last day   21 tablet   0   . promethazine (PHENERGAN) 12.5 MG tablet   Oral   Take 1 tablet (12.5 mg total) by mouth every 6 (six) hours as needed for nausea or vomiting.   30 tablet   0   . promethazine (PHENERGAN) 25 MG suppository   Rectal   Place 1 suppository (25 mg total) rectally every 6 (six) hours as needed for nausea.   12 suppository   1     Allergies Penicillins  No family history on file.  Social History Social History  Substance Use Topics  . Smoking status: Current Every Day Smoker -- 0.50 packs/day  .  Smokeless tobacco: None  . Alcohol Use: No     Review of Systems  Constitutional: No fever/chills Eyes: No visual changes. No discharge ENT: No sore throat. As it for right ear pain. Positive for nasal congestion. Positive for sinus pressure. Cardiovascular: no chest pain. Respiratory: Positive for cough. No SOB. Gastrointestinal: No abdominal pain.  No nausea, no vomiting.  No diarrhea.  No constipation. Genitourinary: Negative for dysuria. No hematuria Musculoskeletal: Negative for back pain. Skin: Negative for rash. Neurological: Negative for headaches, focal weakness or numbness. 10-point ROS otherwise negative.  ____________________________________________   PHYSICAL EXAM:  VITAL SIGNS: ED Triage Vitals  Enc Vitals Group     BP 04/26/15 1625 130/76 mmHg     Pulse Rate 04/26/15 1625 80     Resp 04/26/15 1625 18     Temp 04/26/15 1625 97.6 F (36.4 C)     Temp Source 04/26/15 1625 Oral     SpO2 04/26/15 1625 96 %     Weight 04/26/15  1625 230 lb (104.327 kg)     Height 04/26/15 1625  (1.626 m)     Head Cir --      Peak Flow --      Pain Score 04/26/15 1623 7     Pain Loc --      Pain Edu? --      Excl. in GC? --      Constitutional: Alert and oriented. Well appearing and in no acute distress. Eyes: Conjunctivae are normal. PERRL. EOMI. Head: Atraumatic. ENT:      Ears: EC on left is unremarkable. There is cerumen but it is not impacted. TM is visualized beyond cerumen and is unremarkable. EAC on right side is erythematous and edematous. There is cerumen but it is not impacted. TM is visualized with erythema to TM, moderately bulging, with air-fluid level.      Nose: Severe purulent congestion/rhinnorhea. Frontal and maxillary sinuses are tender to percussion.      Mouth/Throat: Mucous membranes are moist. Oropharynx is mildly erythematous. Postnasal drip is identified. Tonsils are unremarkable bilaterally. Neck: No stridor.   Hematological/Lymphatic/Immunilogical: Diffuse, mobile, nontender anterior cervical lymphadenopathy. Cardiovascular: Normal rate, regular rhythm. Normal S1 and S2.  Good peripheral circulation. Respiratory: Normal respiratory effort without tachypnea or retractions. Lungs CTAB. Gastrointestinal: Soft and nontender. No distention. No CVA tenderness. Musculoskeletal: No lower extremity tenderness nor edema.  No joint effusions. Neurologic:  Normal speech and language. No gross focal neurologic deficits are appreciated.  Skin:  Skin is warm, dry and intact. No rash noted. Psychiatric: Mood and affect are normal. Speech and behavior are normal. Patient exhibits appropriate insight and judgement.   ____________________________________________   LABS (all labs ordered are listed, but only abnormal results are displayed)  Labs Reviewed - No data to display ____________________________________________  EKG   ____________________________________________  RADIOLOGY   No results  found.  ____________________________________________    PROCEDURES  Procedure(s) performed:       Medications - No data to display   ____________________________________________   INITIAL IMPRESSION / ASSESSMENT AND PLAN / ED COURSE  Pertinent labs & imaging results that were available during my care of the patient were reviewed by me and considered in my medical decision making (see chart for details).  Patient's diagnosis is consistent with right-sided otitis externa, right-sided otitis media, frontal and maxillary sinusitis.. Patient will be discharged home with prescriptions for oral antibiotics, antibiotic eardrops, Flonase and Zyrtec. Patient is to follow up with [facility] care provider if symptoms  persist past this treatment course. Patient is given ED precautions to return to the ED for any worsening or new symptoms.     ____________________________________________  FINAL CLINICAL IMPRESSION(S) / ED DIAGNOSES  Final diagnoses:  Otitis externa, right  Acute mucoid otitis media of right ear  Acute maxillary sinusitis, recurrence not specified      NEW MEDICATIONS STARTED DURING THIS VISIT:  New Prescriptions   CETIRIZINE (ZYRTEC) 10 MG TABLET    Take 1 tablet (10 mg total) by mouth daily.   DOXYCYCLINE (VIBRA-TABS) 100 MG TABLET    Take 1 tablet (100 mg total) by mouth 2 (two) times daily.   FLUTICASONE (FLONASE) 50 MCG/ACT NASAL SPRAY    Place 1 spray into both nostrils 2 (two) times daily.   NEOMYCIN-POLYMYXIN-HYDROCORTISONE (CORTISPORIN) OTIC SOLUTION    Place 3 drops into the right ear 3 (three) times daily.        Delorise Royals Cuthriell, PA-C 04/26/15 1906  Jeanmarie Plant, MD 04/26/15 1950

## 2015-05-21 ENCOUNTER — Other Ambulatory Visit: Payer: Self-pay

## 2015-05-21 DIAGNOSIS — J449 Chronic obstructive pulmonary disease, unspecified: Secondary | ICD-10-CM

## 2015-06-13 ENCOUNTER — Emergency Department
Admission: EM | Admit: 2015-06-13 | Discharge: 2015-06-13 | Disposition: A | Discharge status: Home / Self Care | Payer: Self-pay | Attending: Emergency Medicine | Admitting: Emergency Medicine

## 2015-06-13 ENCOUNTER — Emergency Department: Payer: Self-pay

## 2015-06-13 DIAGNOSIS — R109 Unspecified abdominal pain: Secondary | ICD-10-CM

## 2015-06-13 DIAGNOSIS — R1011 Right upper quadrant pain: Secondary | ICD-10-CM | POA: Insufficient documentation

## 2015-06-13 DIAGNOSIS — Z79899 Other long term (current) drug therapy: Secondary | ICD-10-CM | POA: Insufficient documentation

## 2015-06-13 DIAGNOSIS — F17210 Nicotine dependence, cigarettes, uncomplicated: Secondary | ICD-10-CM | POA: Insufficient documentation

## 2015-06-13 DIAGNOSIS — M5030 Other cervical disc degeneration, unspecified cervical region: Secondary | ICD-10-CM | POA: Insufficient documentation

## 2015-06-13 DIAGNOSIS — J449 Chronic obstructive pulmonary disease, unspecified: Secondary | ICD-10-CM | POA: Insufficient documentation

## 2015-06-13 DIAGNOSIS — R1031 Right lower quadrant pain: Secondary | ICD-10-CM | POA: Insufficient documentation

## 2015-06-13 DIAGNOSIS — Z7951 Long term (current) use of inhaled steroids: Secondary | ICD-10-CM | POA: Insufficient documentation

## 2015-06-13 LAB — COMPREHENSIVE METABOLIC PANEL
ALBUMIN: 4.5 g/dL (ref 3.5–5.0)
ALT: 29 U/L (ref 14–54)
AST: 29 U/L (ref 15–41)
Alkaline Phosphatase: 55 U/L (ref 38–126)
Anion gap: 5 (ref 5–15)
BUN: 14 mg/dL (ref 6–20)
CHLORIDE: 105 mmol/L (ref 101–111)
CO2: 28 mmol/L (ref 22–32)
CREATININE: 1.07 mg/dL — AB (ref 0.44–1.00)
Calcium: 9.3 mg/dL (ref 8.9–10.3)
GFR calc Af Amer: >60 mL/min (ref >60)
GLUCOSE: 75 mg/dL (ref 65–99)
POTASSIUM: 3.5 mmol/L (ref 3.5–5.1)
SODIUM: 138 mmol/L (ref 135–145)
Total Bilirubin: 0.5 mg/dL (ref 0.3–1.2)
Total Protein: 7.4 g/dL (ref 6.5–8.1)

## 2015-06-13 LAB — CBC
HEMATOCRIT: 40.7 % (ref 35.0–47.0)
Hemoglobin: 13.8 g/dL (ref 12.0–16.0)
MCH: 31.1 pg (ref 26.0–34.0)
MCHC: 33.9 g/dL (ref 32.0–36.0)
MCV: 91.7 fL (ref 80.0–100.0)
PLATELETS: 346 10*3/uL (ref 150–440)
RBC: 4.44 MIL/uL (ref 3.80–5.20)
RDW: 13.6 % (ref 11.5–14.5)
WBC: 9.1 10*3/uL (ref 3.6–11.0)

## 2015-06-13 LAB — URINALYSIS COMPLETE WITH MICROSCOPIC (ARMC ONLY)
BILIRUBIN URINE: NEGATIVE
GLUCOSE, UA: NEGATIVE mg/dL
Ketones, ur: NEGATIVE mg/dL
Leukocytes, UA: NEGATIVE
Nitrite: NEGATIVE
Protein, ur: NEGATIVE mg/dL
Specific Gravity, Urine: 1.018 (ref 1.005–1.030)
pH: 7 (ref 5.0–8.0)

## 2015-06-13 LAB — LIPASE, BLOOD: LIPASE: 25 U/L (ref 11–51)

## 2015-06-13 LAB — POCT PREGNANCY, URINE: PREG TEST UR: NEGATIVE

## 2015-06-13 LAB — TROPONIN I

## 2015-06-13 MED ORDER — IOHEXOL 300 MG/ML  SOLN
100.0000 mL | Freq: Once | INTRAMUSCULAR | Status: AC | PRN
  Administered 2015-06-13: 100 mL via INTRAVENOUS

## 2015-06-13 MED ORDER — MORPHINE SULFATE (PF) 4 MG/ML IV SOLN
4.0000 mg | Freq: Once | INTRAVENOUS | Status: AC
  Administered 2015-06-13: 4 mg via INTRAVENOUS
  Filled 2015-06-13: qty 1

## 2015-06-13 MED ORDER — CARISOPRODOL 350 MG PO TABS: 350.0000 mg | ORAL_TABLET | Freq: Three times a day (TID) | ORAL | Status: DC | PRN

## 2015-06-13 MED ORDER — SODIUM CHLORIDE 0.9 % IV BOLUS (SEPSIS)
1000.0000 mL | Freq: Once | INTRAVENOUS | Status: AC
  Administered 2015-06-13: 1000 mL via INTRAVENOUS

## 2015-06-13 MED ORDER — ONDANSETRON 4 MG PO TBDP
4.0000 mg | ORAL_TABLET | Freq: Once | ORAL | Status: AC | PRN
  Administered 2015-06-13: 4 mg via ORAL

## 2015-06-13 MED ORDER — ONDANSETRON 4 MG PO TBDP
ORAL_TABLET | ORAL | Status: AC
  Filled 2015-06-13: qty 1

## 2015-06-13 MED ORDER — IOHEXOL 240 MG/ML SOLN
25.0000 mL | Freq: Once | INTRAMUSCULAR | Status: AC | PRN
  Administered 2015-06-13: 25 mL via ORAL

## 2015-06-13 MED ORDER — ONDANSETRON HCL 4 MG/2ML IJ SOLN
4.0000 mg | Freq: Once | INTRAMUSCULAR | Status: AC
  Administered 2015-06-13: 4 mg via INTRAVENOUS
  Filled 2015-06-13: qty 2

## 2015-06-13 NOTE — Discharge Instructions (Signed)
Abdominal Pain, Adult °Many things can cause abdominal pain. Usually, abdominal pain is not caused by a disease and will improve without treatment. It can often be observed and treated at home. Your health care provider will do a physical exam and possibly order blood tests and X-rays to help determine the seriousness of your pain. However, in many cases, more time must pass before a clear cause of the pain can be found. Before that point, your health care provider may not know if you need more testing or further treatment. °HOME CARE INSTRUCTIONS °Monitor your abdominal pain for any changes. The following actions may help to alleviate any discomfort you are experiencing: °· Only take over-the-counter or prescription medicines as directed by your health care provider. °· Do not take laxatives unless directed to do so by your health care provider. °· Try a clear liquid diet (broth, tea, or water) as directed by your health care provider. Slowly move to a bland diet as tolerated. °SEEK MEDICAL CARE IF: °· You have unexplained abdominal pain. °· You have abdominal pain associated with nausea or diarrhea. °· You have pain when you urinate or have a bowel movement. °· You experience abdominal pain that wakes you in the night. °· You have abdominal pain that is worsened or improved by eating food. °· You have abdominal pain that is worsened with eating fatty foods. °· You have a fever. °SEEK IMMEDIATE MEDICAL CARE IF: °· Your pain does not go away within 2 hours. °· You keep throwing up (vomiting). °· Your pain is felt only in portions of the abdomen, such as the right side or the left lower portion of the abdomen. °· You pass bloody or black tarry stools. °MAKE SURE YOU: °· Understand these instructions. °· Will watch your condition. °· Will get help right away if you are not doing well or get worse. °  °This information is not intended to replace advice given to you by your health care provider. Make sure you discuss  any questions you have with your health care provider. °  °Document Released: 12/28/2004 Document Revised: 12/09/2014 Document Reviewed: 11/27/2012 °Elsevier Interactive Patient Education ©2016 [employer] ° °Flank Pain °Flank pain refers to pain that is located on the side of the body between the upper abdomen and the back. The pain may occur over a short period of time (acute) or may be long-term or reoccurring (chronic). It may be mild or severe. Flank pain can be caused by many things. °CAUSES  °Some of the more common causes of flank pain include: °· Muscle strains.   °· Muscle spasms.   °· A disease of your spine (vertebral disk disease).   °· A lung infection (pneumonia).   °· Fluid around your lungs (pulmonary edema).   °· A kidney infection.   °· Kidney stones.   °· A very painful skin rash caused by the chickenpox virus (shingles).   °· Gallbladder disease.   °HOME CARE INSTRUCTIONS  °Home care will depend on the cause of your pain. In general, °· Rest as directed by your caregiver. °· Drink enough fluids to keep your urine clear or pale yellow. °· Only take over-the-counter or prescription medicines as directed by your caregiver. Some medicines may help relieve the pain. °· Tell your caregiver about any changes in your pain. °· Follow up with your caregiver as directed. °SEEK IMMEDIATE MEDICAL CARE IF:  °· Your pain is not controlled with medicine.   °· You have new or worsening symptoms. °· Your pain increases.   °· You have abdominal   pain.   °· You have shortness of breath.   °· You have persistent nausea or vomiting.   °· You have swelling in your abdomen.   °· You feel faint or pass out.   °· You have blood in your urine. °· You have a fever or persistent symptoms for more than 2-3 days. °· You have a fever and your symptoms suddenly get worse. °MAKE SURE YOU:  °· Understand these instructions. °· Will watch your condition. °· Will get help right away if you are not doing well or get worse. °  °This  information is not intended to replace advice given to you by your health care provider. Make sure you discuss any questions you have with your health care provider. °  °Document Released: 05/11/2005 Document Revised: 12/13/2011 Document Reviewed: 11/02/2011 °Elsevier Interactive Patient Education ©2016 [employer]. ° °

## 2015-06-13 NOTE — ED Notes (Signed)
Pt c/o sudden onset right side abdominal pain last night.  Denies diarrhea,  No fever,

## 2015-06-13 NOTE — ED Notes (Signed)
Patient transported to CT 

## 2015-06-13 NOTE — ED Notes (Signed)
Pt is now actively vomiting in triage.  And she states that the vomiting is making her pain worse.

## 2015-06-13 NOTE — ED Notes (Signed)
Pt became nauseated in triage,   She heaved once and belched but did not vomit.   Pt states "i have nausea and throw up every day of my life"

## 2015-06-13 NOTE — ED Provider Notes (Signed)
[facility] Emergency Department Provider Note  ____________________________________________  Time seen: Approximately 550 PM  I have reviewed the triage vital signs and the nursing notes.   HISTORY  Chief Complaint Abdominal Pain    HPI Hannah Shah is a 45 y.o. female with a history of spinal stenosis and chronic vomiting who was presented to the emergency department today with 1 of sharp right upper quadrant abdominal pain radiating to her right back. She says that she has associated nausea and vomiting but this is her baseline nausea and vomiting. Denies any difficulty with urination. Says the pain does not worsen with eating. Says that the pain is made out of 10 at this time. Says that she still has her gallbladder as well as appendix. Denies any chest pain or shortness of breath.   Past Medical History  Diagnosis Date  . Spinal stenosis   . DDD (degenerative disc disease), cervical   . COPD (chronic obstructive pulmonary disease) (HCC)     There are no active problems to display for this patient.   Past Surgical History  Procedure Laterality Date  . Tubal ligation  1994    Current Outpatient Rx  Name  Route  Sig  Dispense  Refill  . azithromycin (ZITHROMAX Z-PAK) 250 MG tablet      Take 2 tablets (500 mg) on  Day 1,  followed by 1 tablet (250 mg) once daily on Days 2 through 5.   6 each   0   . cetirizine (ZYRTEC) 10 MG tablet   Oral   Take 1 tablet (10 mg total) by mouth daily.   30 tablet   0   . doxycycline (VIBRA-TABS) 100 MG tablet   Oral   Take 1 tablet (100 mg total) by mouth 2 (two) times daily.   14 tablet   0   . fluticasone (FLONASE) 50 MCG/ACT nasal spray   Each Nare   Place 1 spray into both nostrils 2 (two) times daily.   16 g   0   . guaiFENesin-codeine 100-10 MG/5ML syrup   Oral   Take 10 mLs by mouth every 4 (four) hours as needed for cough.   180 mL   0   . meloxicam (MOBIC) 15 MG tablet   Oral    Take 1 tablet (15 mg total) by mouth daily.   30 tablet   0   . predniSONE (DELTASONE) 10 MG tablet      Take 6 tablets  today, on day 2 take 5 tablets, day 3 take 4 tablets, day 4 take 3 tablets, day 5 take  2 tablets and 1 tablet the last day   21 tablet   0   . promethazine (PHENERGAN) 12.5 MG tablet   Oral   Take 1 tablet (12.5 mg total) by mouth every 6 (six) hours as needed for nausea or vomiting.   30 tablet   0   . promethazine (PHENERGAN) 25 MG suppository   Rectal   Place 1 suppository (25 mg total) rectally every 6 (six) hours as needed for nausea.   12 suppository   1     Allergies Penicillins  No family history on file.  Social History Social History  Substance Use Topics  . Smoking status: Current Every Day Smoker -- 0.50 packs/day for 15 years  . Smokeless tobacco: None  . Alcohol Use: No    Review of Systems Constitutional: No fever/chills Eyes: No visual changes. ENT: No sore throat.  Cardiovascular: Denies chest pain. Respiratory: Denies shortness of breath. Gastrointestinal:   No diarrhea.  No constipation. Genitourinary: Negative for dysuria. Musculoskeletal: Negative for back pain. Skin: Negative for rash. Neurological: Negative for headaches, focal weakness or numbness.  10-point ROS otherwise negative.  ____________________________________________   PHYSICAL EXAM:  VITAL SIGNS: ED Triage Vitals  Enc Vitals Group     BP 06/13/15 1609 120/63 mmHg     Pulse Rate 06/13/15 1609 86     Resp 06/13/15 1609 24     Temp 06/13/15 1609 98 F (36.7 C)     Temp Source 06/13/15 1609 Oral     SpO2 06/13/15 1609 97 %     Weight 06/13/15 1609 201 lb (91.173 kg)     Height 06/13/15 1609 5\' 4"  (1.626 m)     Head Cir --      Peak Flow --      Pain Score 06/13/15 1610 9     Pain Loc --      Pain Edu? --      Excl. in GC? --     Constitutional: Alert and oriented. Well appearing and in no acute distress. Eyes: Conjunctivae are normal. PERRL.  EOMI. Head: Atraumatic. Nose: No congestion/rhinnorhea. Mouth/Throat: Mucous membranes are moist.   Neck: No stridor.   Cardiovascular: Normal rate, regular rhythm. Grossly normal heart sounds.  Good peripheral circulation. Respiratory: Normal respiratory effort.  No retractions. Lungs CTAB. Gastrointestinal: Soft with right upper quadrant tenderness palpation as well as right lower quadrant tenderness to palpation. There is no rebound or guarding. There is a negative Murphy sign. No distention. mild right-sided CVA tenderness palpation. Musculoskeletal: No lower extremity tenderness nor edema.  No joint effusions. Neurologic:  Normal speech and language. No gross focal neurologic deficits are appreciated.  Skin:  Skin is warm, dry and intact. No rash noted. Psychiatric: Mood and affect are normal. Speech and behavior are normal.  ____________________________________________   LABS (all labs ordered are listed, but only abnormal results are displayed)  Labs Reviewed  COMPREHENSIVE METABOLIC PANEL - Abnormal; Notable for the following:    Creatinine, Ser 1.07 (*)    All other components within normal limits  URINALYSIS COMPLETEWITH MICROSCOPIC ([facility] ONLY) - Abnormal; Notable for the following:    Color, Urine YELLOW (*)    APPearance CLOUDY (*)    Hgb urine dipstick 1+ (*)    Bacteria, UA RARE (*)    Squamous Epithelial / LPF 6-30 (*)    All other components within normal limits  LIPASE, BLOOD  CBC  TROPONIN I  POC URINE PREG, ED  POCT PREGNANCY, URINE   ____________________________________________  EKG  ED ECG REPORT I, Arelia LongestSchaevitz,  Rayanne Padmanabhan M, the attending physician, personally viewed and interpreted this ECG.   Date: 06/13/2015  EKG Time: 1930  Rate: 60  Rhythm: normal sinus rhythm  Axis: Normal  Intervals:none  ST&T Change: No ST segment elevation or depression. No abnormal T-wave  inversion.  ____________________________________________  RADIOLOGY  IMPRESSION: No acute abnormality seen within the abdomen or pelvis.   Electronically Signed By: Roanna RaiderJeffery Chang M.D. On: 06/13/2015 18:43     IMPRESSION: Stable chest. No active cardiopulmonary process.   Electronically Signed By: Carey BullocksWilliam Veazey M.D.  ____________________________________________   PROCEDURES   ____________________________________________   INITIAL IMPRESSION / ASSESSMENT AND PLAN / ED COURSE  Pertinent labs & imaging results that were available during my care of the patient were reviewed by me and considered in my medical decision making (see chart for  details).  ----------------------------------------- 7:58 PM on 06/13/2015 -----------------------------------------  Patient was reevaluated and pain is improved after morphine. I reviewed her very reassuring workup with her and she is convinced that they're musty something wrong with her. She is now saying the pain was radiating up into her chest. Patient is PERC negative.  Denies being on any hormone supplements. Still with tenderness to the right flank over the lower ribs. I'm suspecting this is mostly musculoskeletal.  ----------------------------------------- 8:32 PM on 06/13/2015 -----------------------------------------  Reviewed the remainder of the labs and imaging results with the patient as well as her friend is at the bedside. Her workup is very reassuring. I'll be discharging her with Soma. She will also try muscle cream such as Aspercreme or icy hot at home. I also recommended ice to the area. She understands the plan and is willing to comply. We also reviewed return precautions and she'll return for any worsening or concerning symptoms. ____________________________________________   FINAL CLINICAL IMPRESSION(S) / ED DIAGNOSES  Right flank pain. Right upper quadrant abdominal pain.    Myrna Blazer, MD 06/13/15 2033

## 2015-06-24 ENCOUNTER — Ambulatory Visit: Payer: Self-pay

## 2015-07-06 ENCOUNTER — Ambulatory Visit: Payer: Self-pay

## 2015-08-03 ENCOUNTER — Ambulatory Visit: Payer: Self-pay

## 2015-10-12 ENCOUNTER — Ambulatory Visit: Payer: Disability Insurance

## 2015-10-12 DIAGNOSIS — J449 Chronic obstructive pulmonary disease, unspecified: Secondary | ICD-10-CM | POA: Diagnosis present

## 2015-10-12 MED ORDER — ALBUTEROL SULFATE (2.5 MG/3ML) 0.083% IN NEBU
2.5000 mg | INHALATION_SOLUTION | Freq: Once | RESPIRATORY_TRACT | Status: AC
  Administered 2015-10-12: 2.5 mg via RESPIRATORY_TRACT
  Filled 2015-10-12: qty 3

## 2016-06-15 ENCOUNTER — Emergency Department: Payer: Self-pay

## 2016-06-15 ENCOUNTER — Observation Stay
Admission: EM | Admit: 2016-06-15 | Discharge: 2016-06-17 | Disposition: A | Discharge status: Home / Self Care | Payer: Self-pay | Attending: Internal Medicine | Admitting: Internal Medicine

## 2016-06-15 ENCOUNTER — Encounter: Payer: Self-pay | Admitting: Specialist

## 2016-06-15 DIAGNOSIS — F17210 Nicotine dependence, cigarettes, uncomplicated: Secondary | ICD-10-CM | POA: Insufficient documentation

## 2016-06-15 DIAGNOSIS — B958 Unspecified staphylococcus as the cause of diseases classified elsewhere: Secondary | ICD-10-CM | POA: Insufficient documentation

## 2016-06-15 DIAGNOSIS — R6889 Other general symptoms and signs: Secondary | ICD-10-CM

## 2016-06-15 DIAGNOSIS — J111 Influenza due to unidentified influenza virus with other respiratory manifestations: Secondary | ICD-10-CM

## 2016-06-15 DIAGNOSIS — J449 Chronic obstructive pulmonary disease, unspecified: Secondary | ICD-10-CM | POA: Insufficient documentation

## 2016-06-15 DIAGNOSIS — J101 Influenza due to other identified influenza virus with other respiratory manifestations: Principal | ICD-10-CM | POA: Insufficient documentation

## 2016-06-15 LAB — CBC
HCT: 40.3 % (ref 35.0–47.0)
Hemoglobin: 13.8 g/dL (ref 12.0–16.0)
MCH: 32 pg (ref 26.0–34.0)
MCHC: 34.2 g/dL (ref 32.0–36.0)
MCV: 93.5 fL (ref 80.0–100.0)
Platelets: 280 10*3/uL (ref 150–440)
RBC: 4.31 MIL/uL (ref 3.80–5.20)
RDW: 13.7 % (ref 11.5–14.5)
WBC: 7.4 10*3/uL (ref 3.6–11.0)

## 2016-06-15 LAB — URINALYSIS, COMPLETE (UACMP) WITH MICROSCOPIC
Bacteria, UA: NONE SEEN
Bilirubin Urine: NEGATIVE
Glucose, UA: NEGATIVE mg/dL
Ketones, ur: 5 mg/dL — AB
Leukocytes, UA: NEGATIVE
Nitrite: NEGATIVE
Protein, ur: NEGATIVE mg/dL
Specific Gravity, Urine: 1.018 (ref 1.005–1.030)
pH: 6 (ref 5.0–8.0)

## 2016-06-15 LAB — COMPREHENSIVE METABOLIC PANEL
ALK PHOS: 42 U/L (ref 38–126)
ALT: 26 U/L (ref 14–54)
AST: 30 U/L (ref 15–41)
Albumin: 3.9 g/dL (ref 3.5–5.0)
Anion gap: 5 (ref 5–15)
BILIRUBIN TOTAL: 0.4 mg/dL (ref 0.3–1.2)
BUN: 11 mg/dL (ref 6–20)
CO2: 24 mmol/L (ref 22–32)
CREATININE: 0.85 mg/dL (ref 0.44–1.00)
Calcium: 8.8 mg/dL — ABNORMAL LOW (ref 8.9–10.3)
Chloride: 108 mmol/L (ref 101–111)
GFR calc Af Amer: >60 mL/min (ref >60)
Glucose, Bld: 94 mg/dL (ref 65–99)
Potassium: 3.5 mmol/L (ref 3.5–5.1)
Sodium: 137 mmol/L (ref 135–145)
TOTAL PROTEIN: 6.9 g/dL (ref 6.5–8.1)

## 2016-06-15 LAB — LACTIC ACID, PLASMA: Lactic Acid, Venous: 0.6 mmol/L (ref 0.5–1.9)

## 2016-06-15 LAB — LIPASE, BLOOD: Lipase: 15 U/L (ref 11–51)

## 2016-06-15 LAB — INFLUENZA PANEL BY PCR (TYPE A & B)
Influenza A By PCR: POSITIVE — AB
Influenza B By PCR: NEGATIVE

## 2016-06-15 LAB — TROPONIN I: Troponin I: <0.03 ng/mL (ref <0.03)

## 2016-06-15 MED ORDER — KETOROLAC TROMETHAMINE 30 MG/ML IJ SOLN
30.0000 mg | Freq: Once | INTRAMUSCULAR | Status: AC
  Administered 2016-06-15: 30 mg via INTRAVENOUS
  Filled 2016-06-15: qty 1

## 2016-06-15 MED ORDER — ONDANSETRON HCL 4 MG/2ML IJ SOLN
4.0000 mg | Freq: Once | INTRAMUSCULAR | Status: AC
  Administered 2016-06-15: 4 mg via INTRAVENOUS
  Filled 2016-06-15: qty 2

## 2016-06-15 MED ORDER — ACETAMINOPHEN 325 MG PO TABS
650.0000 mg | ORAL_TABLET | Freq: Four times a day (QID) | ORAL | Status: DC | PRN
  Administered 2016-06-15 – 2016-06-16 (×3): 650 mg via ORAL
  Filled 2016-06-15 (×3): qty 2

## 2016-06-15 MED ORDER — ONDANSETRON HCL 4 MG/2ML IJ SOLN
4.0000 mg | Freq: Four times a day (QID) | INTRAMUSCULAR | Status: DC | PRN
  Administered 2016-06-15: 4 mg via INTRAVENOUS
  Filled 2016-06-15: qty 2

## 2016-06-15 MED ORDER — ENOXAPARIN SODIUM 40 MG/0.4ML ~~LOC~~ SOLN
40.0000 mg | SUBCUTANEOUS | Status: DC
  Administered 2016-06-15 – 2016-06-16 (×2): 40 mg via SUBCUTANEOUS
  Filled 2016-06-15 (×2): qty 0.4

## 2016-06-15 MED ORDER — SODIUM CHLORIDE 0.9 % IV SOLN
INTRAVENOUS | Status: DC
  Administered 2016-06-15: 1 mL via INTRAVENOUS

## 2016-06-15 MED ORDER — OSELTAMIVIR PHOSPHATE 75 MG PO CAPS
75.0000 mg | ORAL_CAPSULE | Freq: Two times a day (BID) | ORAL | Status: DC
  Administered 2016-06-15 – 2016-06-17 (×4): 75 mg via ORAL
  Filled 2016-06-15 (×4): qty 1

## 2016-06-15 MED ORDER — SODIUM CHLORIDE 0.9 % IV BOLUS (SEPSIS)
1000.0000 mL | Freq: Once | INTRAVENOUS | Status: AC
  Administered 2016-06-15: 1000 mL via INTRAVENOUS

## 2016-06-15 MED ORDER — ACETAMINOPHEN 650 MG RE SUPP: 650.0000 mg | Freq: Four times a day (QID) | RECTAL | Status: DC | PRN

## 2016-06-15 MED ORDER — IPRATROPIUM-ALBUTEROL 0.5-2.5 (3) MG/3ML IN SOLN: 3.0000 mL | Freq: Four times a day (QID) | RESPIRATORY_TRACT | Status: DC | PRN

## 2016-06-15 MED ORDER — OSELTAMIVIR PHOSPHATE 75 MG PO CAPS
75.0000 mg | ORAL_CAPSULE | Freq: Once | ORAL | Status: AC
  Administered 2016-06-15: 75 mg via ORAL
  Filled 2016-06-15: qty 1

## 2016-06-15 MED ORDER — ONDANSETRON HCL 4 MG PO TABS: 4.0000 mg | ORAL_TABLET | Freq: Three times a day (TID) | ORAL | 0 refills | Status: DC | PRN

## 2016-06-15 MED ORDER — ONDANSETRON HCL 4 MG PO TABS: 4.0000 mg | ORAL_TABLET | Freq: Four times a day (QID) | ORAL | Status: DC | PRN

## 2016-06-15 MED ORDER — ACETAMINOPHEN 325 MG PO TABS
650.0000 mg | ORAL_TABLET | Freq: Once | ORAL | Status: AC
  Administered 2016-06-15: 650 mg via ORAL
  Filled 2016-06-15: qty 2

## 2016-06-15 NOTE — ED Provider Notes (Signed)
[facility] Emergency Department Provider Note ____________________________________________   I have reviewed the triage vital signs and the triage nursing note.  HISTORY  Chief Complaint Shortness of Breath; Fever; and Emesis   Historian Patient  HPI Hannah Shah is a 46 y.o. female presents with flulike illness, complains of nonproductive cough, body aches, subjective fever and chills, and several episodes of nonbloody nonbilious emesis since this morning. Denies abdominal pain specifically. Denies dysuria.  Symptoms are moderate in intensity.    Past Medical History:  Diagnosis Date  . COPD (chronic obstructive pulmonary disease) (HCC)   . DDD (degenerative disc disease), cervical   . Spinal stenosis     There are no active problems to display for this patient.   Past Surgical History:  Procedure Laterality Date  . TUBAL LIGATION  1994    Prior to Admission medications   Medication Sig Start Date End Date Taking? Authorizing Provider  albuterol (PROVENTIL HFA;VENTOLIN HFA) 108 (90 Base) MCG/ACT inhaler Inhale 2 puffs into the lungs every 4 (four) hours as needed for wheezing or shortness of breath.   Yes Historical Provider, MD  azithromycin (ZITHROMAX Z-PAK) 250 MG tablet Take 2 tablets (500 mg) on  Day 1,  followed by 1 tablet (250 mg) once daily on Days 2 through 5. Patient not taking: Reported on 06/15/2016 03/01/15   Evangeline Dakin, PA-C  carisoprodol (SOMA) 350 MG tablet Take 1 tablet (350 mg total) by mouth 3 (three) times daily as needed for muscle spasms. Patient not taking: Reported on 06/15/2016 06/13/15   Myrna Blazer, MD  cetirizine (ZYRTEC) 10 MG tablet Take 1 tablet (10 mg total) by mouth daily. Patient not taking: Reported on 06/15/2016 04/26/15   Delorise Royals Cuthriell, PA-C  doxycycline (VIBRA-TABS) 100 MG tablet Take 1 tablet (100 mg total) by mouth 2 (two) times daily. Patient not taking: Reported on 06/15/2016  04/26/15   Delorise Royals Cuthriell, PA-C  fluticasone (FLONASE) 50 MCG/ACT nasal spray Place 1 spray into both nostrils 2 (two) times daily. Patient not taking: Reported on 06/15/2016 04/26/15   Delorise Royals Cuthriell, PA-C  guaiFENesin-codeine 100-10 MG/5ML syrup Take 10 mLs by mouth every 4 (four) hours as needed for cough. Patient not taking: Reported on 06/15/2016 03/01/15   Charmayne Sheer Beers, PA-C  meloxicam (MOBIC) 15 MG tablet Take 1 tablet (15 mg total) by mouth daily. Patient not taking: Reported on 06/15/2016 03/01/15   Charmayne Sheer Beers, PA-C  ondansetron (ZOFRAN) 4 MG tablet Take 1 tablet (4 mg total) by mouth every 8 (eight) hours as needed for nausea or vomiting. 06/15/16   Governor Rooks, MD  predniSONE (DELTASONE) 10 MG tablet Take 6 tablets  today, on day 2 take 5 tablets, day 3 take 4 tablets, day 4 take 3 tablets, day 5 take  2 tablets and 1 tablet the last day Patient not taking: Reported on 06/15/2016 10/14/14   Tommi Rumps, PA-C  promethazine (PHENERGAN) 12.5 MG tablet Take 1 tablet (12.5 mg total) by mouth every 6 (six) hours as needed for nausea or vomiting. Patient not taking: Reported on 06/15/2016 12/25/14   Phineas Semen, MD  promethazine (PHENERGAN) 25 MG suppository Place 1 suppository (25 mg total) rectally every 6 (six) hours as needed for nausea. Patient not taking: Reported on 06/15/2016 12/25/14 12/25/15  Phineas Semen, MD    Allergies  Allergen Reactions  . Penicillins Swelling    Has patient had a PCN reaction causing immediate rash, facial/tongue/throat swelling, SOB  or lightheadedness with hypotension: yes Has patient had a PCN reaction causing severe rash involving mucus membranes or skin necrosis: yes Has patient had a PCN reaction that required hospitalization no Has patient had a PCN reaction occurring within the last 10 years: no If all of the above answers are "NO", then may proceed with Cephalosporin use.     No family history on file.  Social  History Social History  Substance Use Topics  . Smoking status: Current Every Day Smoker    Packs/day: 0.50    Years: 15.00  . Smokeless tobacco: Not on file  . Alcohol use No    Review of Systems  Constitutional:Negative fever and chills. Eyes: Negative for visual changes. ENT: Negative for sore throat. Cardiovascular: Negative for chest pain. Respiratory: Also negative for traumatic cough and mild shortness of breath. Gastrointestinal: Positive for vomiting, negative for abdominal pain or diarrhea. Genitourinary: Negative for dysuria. Musculoskeletal: Negative for back pain. Skin: Negative for rash. Neurological: Negative for headache. 10 point Review of Systems otherwise negative ____________________________________________   PHYSICAL EXAM:  VITAL SIGNS: ED Triage Vitals  Enc Vitals Group     BP 06/15/16 1042 99/61     Pulse Rate 06/15/16 1042 98     Resp 06/15/16 1042 18     Temp 06/15/16 1042 99 F (37.2 C)     Temp Source 06/15/16 1042 Oral     SpO2 06/15/16 1042 95 %     Weight 06/15/16 1043 204 lb (92.5 kg)     Height 06/15/16 1043 5\' 4"  (1.626 m)     Head Circumference --      Peak Flow --      Pain Score 06/15/16 1043 9     Pain Loc --      Pain Edu? --      Excl. in GC? --      Constitutional: Alert and oriented. Looks like she doesn't feel that well. HEENT   Head: Normocephalic and atraumatic.      Eyes: Conjunctivae are normal. PERRL. Normal extraocular movements.      Ears:         Nose: No congestion/rhinnorhea.   Mouth/Throat: Mucous membranes are moist.   Neck: No stridor. Cardiovascular/Chest: Normal rate, regular rhythm.  No murmurs, rubs, or gallops. Respiratory: Normal respiratory effort without tachypnea nor retractions. Breath sounds are clear and equal bilaterally. No wheezes/rales/rhonchi. Gastrointestinal: Soft. No distention, no guarding, no rebound. Nontender.    Genitourinary/rectal:Deferred Musculoskeletal: Nontender  with normal range of motion in all extremities. No joint effusions.  No lower extremity tenderness.  No edema. Neurologic:  Normal speech and language. No gross or focal neurologic deficits are appreciated. Skin:  Skin is warm, dry and intact. No rash noted. Psychiatric: Mood and affect are normal. Speech and behavior are normal. Patient exhibits appropriate insight and judgment.   ____________________________________________  LABS (pertinent positives/negatives)  Labs Reviewed  COMPREHENSIVE METABOLIC PANEL - Abnormal; Notable for the following:       Result Value   Calcium 8.8 (*)    All other components within normal limits  URINALYSIS, COMPLETE (UACMP) WITH MICROSCOPIC - Abnormal; Notable for the following:    Color, Urine YELLOW (*)    APPearance CLEAR (*)    Hgb urine dipstick MODERATE (*)    Ketones, ur 5 (*)    Squamous Epithelial / LPF 6-30 (*)    All other components within normal limits  INFLUENZA PANEL BY PCR (TYPE A & B) - Abnormal; Notable  for the following:    Influenza A By PCR POSITIVE (*)    All other components within normal limits  CULTURE, BLOOD (ROUTINE X 2)  CULTURE, BLOOD (ROUTINE X 2)  LIPASE, BLOOD  CBC  LACTIC ACID, PLASMA  TROPONIN I  LACTIC ACID, PLASMA    ____________________________________________    EKG I, Governor Rooksebecca Keya Wynes, MD, the attending physician have personally viewed and interpreted all ECGs.  97 bpm. Normal sinus rhythm. Narrow QRS. Nonspecific ST and T-wave ____________________________________________  RADIOLOGY All Xrays were viewed by me. Imaging interpreted by Radiologist.  Chest x-ray two-view: No edema or consolidation. __________________________________________  PROCEDURES  Procedure(s) performed: None  Critical Care performed: None  ____________________________________________   ED COURSE / ASSESSMENT AND PLAN  Pertinent labs & imaging results that were available during my care of the patient were reviewed by me  and considered in my medical decision making (see chart for details).   Ms. Theda BelfastChildress also she's having flulike illness. Afebrile without tachycardia or hypotension or hypoxia. However she looks like she feels miserable. She was given IV fluids and Zofran.  Chest x-ray without evidence of pneumonia.  Laboratory studies are reassuring without elderly white blood cell count, acute anemia, acute renal failure or electronic disturbance.  She is having particular symptoms such as chest pain or abdominal pain.  Having body aches. I still think her symptoms are clinically consistent with a flulike illness.   Document under her vital signs, she did have a temperature of 100, and at one point did have one heart rate documented 102, when I'm in the room with her its typically 90s.  I did add on blood cultures and a lactate.  I'm still waiting on the influenza, lactate, and urinalysis. I'm adding on orthostatics.  Influenza A positive.  Troponin negative. Urinalysis without signs of urinary tract infection.  Lactate is still pending. I went back to reassess her and she still looks like she feels very unwell. Her blood pressure is borderline with a systolic around 105. She has had several documented vital signs that showed tachycardia in the 105 range.  I'm giving her a second liter of fluids. I do not think that she looks well enough to go home at this point.  We discussed risk and benefit of Tamiflu, and she does have a history of underlying COPD and she looks pretty bad within her first symptoms since this morning, and she did choose to go ahead and start Tamiflu.    CONSULTATIONS:  None   Patient / Family / Caregiver informed of clinical course, medical decision-making process, and agree with plan.     ___________________________________________   FINAL CLINICAL IMPRESSION(S) / ED DIAGNOSES   Final diagnoses:  Flu-like symptoms  Influenza              Note: This  dictation was prepared with Dragon dictation. Any transcriptional errors that result from this process are unintentional    Governor Rooksebecca Rocklin Soderquist, MD 06/15/16 [phone]

## 2016-06-15 NOTE — H&P (Signed)
[facility] - [place] at [facility]   PATIENT NAME: Hannah Shah    MR#:  [id number]  DATE OF BIRTH:  January 12, 1971  DATE OF ADMISSION:  06/15/2016  PRIMARY CARE PHYSICIAN: No PCP Per Patient   REQUESTING/REFERRING PHYSICIAN: Dr. Governor Rooksebecca Lord  CHIEF COMPLAINT:   Chief Complaint  Patient presents with  . Shortness of Breath  . Fever  . Emesis    HISTORY OF PRESENT ILLNESS:  Hannah Shah  is a 46 y.o. female with a known history of COPD, degenerative disc disease, spinal stenosis who presents to the hospital due to nausea vomiting and shortness of breath. Patient says that she was in a usual state of health until nausea vomiting and shortness of breath suddenly today. She had 6 episodes of vomiting which was nonbloody and bilious in nature. She started having body aches chills and was just not feeling well and therefore came to the ER further evaluation. In the emergency room patient was noted to be positive for influenza A by PCR. Hospitalist services were contacted further treatment and evaluation.  PAST MEDICAL HISTORY:   Past Medical History:  Diagnosis Date  . COPD (chronic obstructive pulmonary disease) (HCC)   . DDD (degenerative disc disease), cervical   . Spinal stenosis     PAST SURGICAL HISTORY:   Past Surgical History:  Procedure Laterality Date  . TUBAL LIGATION  1994    SOCIAL HISTORY:   Social History  Substance Use Topics  . Smoking status: Current Every Day Smoker    Packs/day: 0.50    Years: 30.00  . Smokeless tobacco: Not on file  . Alcohol use No    FAMILY HISTORY:   Family History  Problem Relation Age of Onset  . Diabetes Mother   . Heart failure Mother   . Heart attack Father     DRUG ALLERGIES:   Allergies  Allergen Reactions  . Penicillins Swelling    Has patient had a PCN reaction causing immediate rash, facial/tongue/throat swelling, SOB or lightheadedness with hypotension: yes Has patient had a PCN reaction  causing severe rash involving mucus membranes or skin necrosis: yes Has patient had a PCN reaction that required hospitalization no Has patient had a PCN reaction occurring within the last 10 years: no If all of the above answers are "NO", then may proceed with Cephalosporin use.     REVIEW OF SYSTEMS:   Review of Systems  Constitutional: Positive for chills and fever. Negative for weight loss.  HENT: Negative for congestion, nosebleeds and tinnitus.   Eyes: Negative for blurred vision, double vision and redness.  Respiratory: Positive for shortness of breath. Negative for cough and hemoptysis.   Cardiovascular: Negative for chest pain, orthopnea, leg swelling and PND.  Gastrointestinal: Positive for nausea and vomiting. Negative for abdominal pain, diarrhea and melena.  Genitourinary: Negative for dysuria, hematuria and urgency.  Musculoskeletal: Positive for myalgias. Negative for falls and joint pain.  Neurological: Negative for dizziness, tingling, sensory change, focal weakness, seizures, weakness and headaches.  Endo/Heme/Allergies: Negative for polydipsia. Does not bruise/bleed easily.  Psychiatric/Behavioral: Negative for depression and memory loss. The patient is not nervous/anxious.     MEDICATIONS AT HOME:   Prior to Admission medications   Medication Sig Start Date End Date Taking? Authorizing Provider  albuterol (PROVENTIL HFA;VENTOLIN HFA) 108 (90 Base) MCG/ACT inhaler Inhale 2 puffs into the lungs every 4 (four) hours as needed for wheezing or shortness of breath.   Yes Historical Provider, MD  azithromycin [facility](ZITHROMAX  Z-PAK) 250 MG tablet Take 2 tablets (500 mg) on  Day 1,  followed by 1 tablet (250 mg) once daily on Days 2 through 5. Patient not taking: Reported on 06/15/2016 03/01/15   Evangeline Dakin, PA-C  carisoprodol (SOMA) 350 MG tablet Take 1 tablet (350 mg total) by mouth 3 (three) times daily as needed for muscle spasms. Patient not taking: Reported on 06/15/2016  06/13/15   Myrna Blazer, MD  cetirizine (ZYRTEC) 10 MG tablet Take 1 tablet (10 mg total) by mouth daily. Patient not taking: Reported on 06/15/2016 04/26/15   Delorise Royals Cuthriell, PA-C  doxycycline (VIBRA-TABS) 100 MG tablet Take 1 tablet (100 mg total) by mouth 2 (two) times daily. Patient not taking: Reported on 06/15/2016 04/26/15   Delorise Royals Cuthriell, PA-C  fluticasone (FLONASE) 50 MCG/ACT nasal spray Place 1 spray into both nostrils 2 (two) times daily. Patient not taking: Reported on 06/15/2016 04/26/15   Delorise Royals Cuthriell, PA-C  guaiFENesin-codeine 100-10 MG/5ML syrup Take 10 mLs by mouth every 4 (four) hours as needed for cough. Patient not taking: Reported on 06/15/2016 03/01/15   Charmayne Sheer Beers, PA-C  meloxicam (MOBIC) 15 MG tablet Take 1 tablet (15 mg total) by mouth daily. Patient not taking: Reported on 06/15/2016 03/01/15   Charmayne Sheer Beers, PA-C  ondansetron (ZOFRAN) 4 MG tablet Take 1 tablet (4 mg total) by mouth every 8 (eight) hours as needed for nausea or vomiting. 06/15/16   Governor Rooks, MD  predniSONE (DELTASONE) 10 MG tablet Take 6 tablets  today, on day 2 take 5 tablets, day 3 take 4 tablets, day 4 take 3 tablets, day 5 take  2 tablets and 1 tablet the last day Patient not taking: Reported on 06/15/2016 10/14/14   Tommi Rumps, PA-C  promethazine (PHENERGAN) 12.5 MG tablet Take 1 tablet (12.5 mg total) by mouth every 6 (six) hours as needed for nausea or vomiting. Patient not taking: Reported on 06/15/2016 12/25/14   Phineas Semen, MD  promethazine (PHENERGAN) 25 MG suppository Place 1 suppository (25 mg total) rectally every 6 (six) hours as needed for nausea. Patient not taking: Reported on 06/15/2016 12/25/14 12/25/15  Phineas Semen, MD      VITAL SIGNS:  Blood pressure 119/74, pulse (!) 103, temperature 100 F (37.8 C), temperature source Oral, resp. rate 18, height 5\' 4"  (1.626 m), weight 92.5 kg (204 lb), last menstrual period 04/17/2016, SpO2 94  %.  PHYSICAL EXAMINATION:  Physical Exam  GENERAL:  46 y.o.-year-old patient lying in the bed with no acute distress.  EYES: Pupils equal, round, reactive to light and accommodation. No scleral icterus. Extraocular muscles intact.  HEENT: Head atraumatic, normocephalic. Oropharynx and nasopharynx clear. No oropharyngeal erythema, moist oral mucosa  NECK:  Supple, no jugular venous distention. No thyroid enlargement, no tenderness.  LUNGS: Normal breath sounds bilaterally, no wheezing, rales, rhonchi. No use of accessory muscles of respiration.  CARDIOVASCULAR: S1, S2 RRR. No murmurs, rubs, gallops, clicks.  ABDOMEN: Soft, nontender, nondistended. Bowel sounds present. No organomegaly or mass.  EXTREMITIES: No pedal edema, cyanosis, or clubbing. + 2 pedal & radial pulses b/l.   NEUROLOGIC: Cranial nerves II through XII are intact. No focal Motor or sensory deficits appreciated b/l PSYCHIATRIC: The patient is alert and oriented x 3. Good affect.  SKIN: No obvious rash, lesion, or ulcer.   LABORATORY PANEL:   CBC  Recent Labs Lab 06/15/16 1045  WBC 7.4  HGB 13.8  HCT 40.3  PLT 280   ------------------------------------------------------------------------------------------------------------------  Chemistries   Recent Labs Lab 06/15/16 1045  NA 137  K 3.5  CL 108  CO2 24  GLUCOSE 94  BUN 11  CREATININE 0.85  CALCIUM 8.8*  AST 30  ALT 26  ALKPHOS 42  BILITOT 0.4   ------------------------------------------------------------------------------------------------------------------  Cardiac Enzymes  Recent Labs Lab 06/15/16 1045  TROPONINI <0.03   ------------------------------------------------------------------------------------------------------------------  RADIOLOGY:  Dg Chest 2 View  Result Date: 06/15/2016 CLINICAL DATA:  Shortness of breath and fever EXAM: CHEST  2 VIEW COMPARISON:  June 13, 2015 FINDINGS: There is no edema or consolidation. Heart size  and pulmonary vascularity are normal. No adenopathy. There is upper thoracic levoscoliosis. IMPRESSION: No edema or consolidation. Electronically Signed   By: Bretta Bang III M.D.   On: 06/15/2016 11:17     IMPRESSION AND PLAN:   46 year old female with past medical history of COPD, spinal stenosis, degenerative disc disease who presents to the hospital due to nausea vomiting and shortness of breath and myalgias and noted to be positive for the flu.  1. Flu-patient is positive for influenza A PCR. This is likely the cause of patient's nausea vomiting and shortness of breath. -Supportive care with IV fluids, antibiotics. Start patient on Tamiflu. Droplet precautions.  2. Nausea vomiting-supportive care with IV fluids, antibiotics. Patient is not clinically dehydrated by numbers are even clinically.  3. COPD-no acute exacerbation. -Continue duo nebs as needed.    All the records are reviewed and case discussed with ED provider. Management plans discussed with the patient, family and they are in agreement.  CODE STATUS: Full  TOTAL TIME TAKING CARE OF THIS PATIENT: 40 minutes.    Houston Siren M.D on 06/15/2016 at 4:08 PM  Between 7am to 6pm - Pager - [phone]  After 6pm go to [email or web address] - password [email or web address] [facility]  [place] [facility]  Office  [phone]  CC: Primary care physician; No PCP Per Patient

## 2016-06-15 NOTE — ED Notes (Signed)
Dr. Lord at bedside.  

## 2016-06-15 NOTE — ED Notes (Signed)
FIRST NURSE: EMS reports N/V x12 hours, hot to the touch , 4mg  zofran administered IV , 18 gauge left hand, HR100, BP120/80, temp 99.1 oral

## 2016-06-15 NOTE — ED Triage Notes (Signed)
Pt arrived via EMS for reports of SOB, fever, vomiting and body aches that began this morning.

## 2016-06-15 NOTE — ED Notes (Signed)
Pt states body aches, fever, nausea and cough that began this AM, states her grandbaby has been sick of recent, at present pt awake and alert, states she took some OTC cold and flu meds last night

## 2016-06-16 LAB — BLOOD CULTURE ID PANEL (REFLEXED)
Acinetobacter baumannii: NOT DETECTED
CANDIDA KRUSEI: NOT DETECTED
Candida albicans: NOT DETECTED
Candida glabrata: NOT DETECTED
Candida parapsilosis: NOT DETECTED
Candida tropicalis: NOT DETECTED
ENTEROBACTERIACEAE SPECIES: NOT DETECTED
ENTEROCOCCUS SPECIES: NOT DETECTED
ESCHERICHIA COLI: NOT DETECTED
Enterobacter cloacae complex: NOT DETECTED
Haemophilus influenzae: NOT DETECTED
Klebsiella oxytoca: NOT DETECTED
Klebsiella pneumoniae: NOT DETECTED
LISTERIA MONOCYTOGENES: NOT DETECTED
METHICILLIN RESISTANCE: NOT DETECTED
Neisseria meningitidis: NOT DETECTED
PSEUDOMONAS AERUGINOSA: NOT DETECTED
Proteus species: NOT DETECTED
SERRATIA MARCESCENS: NOT DETECTED
STAPHYLOCOCCUS AUREUS BCID: NOT DETECTED
STREPTOCOCCUS AGALACTIAE: NOT DETECTED
STREPTOCOCCUS PNEUMONIAE: NOT DETECTED
Staphylococcus species: DETECTED — AB
Streptococcus pyogenes: NOT DETECTED
Streptococcus species: NOT DETECTED

## 2016-06-16 MED ORDER — IBUPROFEN 400 MG PO TABS: 400.0000 mg | ORAL_TABLET | Freq: Four times a day (QID) | ORAL | Status: DC | PRN

## 2016-06-16 NOTE — Progress Notes (Signed)
[facility] - [place] at [facility]   PATIENT NAME: Hannah Shah    MR#:  [id number]  DATE OF BIRTH:  06/16/1970  SUBJECTIVE:  CHIEF COMPLAINT:   Chief Complaint  Patient presents with  . Shortness of Breath  . Fever  . Emesis   Still has nausea. No vomiting Tmax 99.6  REVIEW OF SYSTEMS:    Review of Systems  Constitutional: Positive for malaise/fatigue. Negative for chills and fever.  HENT: Negative for sore throat.   Eyes: Negative for blurred vision, double vision and pain.  Respiratory: Negative for cough, hemoptysis, shortness of breath and wheezing.   Cardiovascular: Negative for chest pain, palpitations, orthopnea and leg swelling.  Gastrointestinal: Negative for abdominal pain, constipation, diarrhea, heartburn, nausea and vomiting.  Genitourinary: Negative for dysuria and hematuria.  Musculoskeletal: Negative for back pain and joint pain.  Skin: Negative for rash.  Neurological: Positive for weakness. Negative for sensory change, speech change, focal weakness and headaches.  Endo/Heme/Allergies: Does not bruise/bleed easily.  Psychiatric/Behavioral: Negative for depression. The patient is not nervous/anxious.     DRUG ALLERGIES:   Allergies  Allergen Reactions  . Penicillins Swelling    Has patient had a PCN reaction causing immediate rash, facial/tongue/throat swelling, SOB or lightheadedness with hypotension: yes Has patient had a PCN reaction causing severe rash involving mucus membranes or skin necrosis: yes Has patient had a PCN reaction that required hospitalization no Has patient had a PCN reaction occurring within the last 10 years: no If all of the above answers are "NO", then may proceed with Cephalosporin use.     VITALS:  Blood pressure 118/72, pulse 72, temperature 99.1 F (37.3 C), temperature source Oral, resp. rate 18, height 5\' 4"  (1.626 m), weight 92.5 kg (204 lb), last menstrual period 04/17/2016, SpO2 94 %.  PHYSICAL  EXAMINATION:   Physical Exam  GENERAL:  46 y.o.-year-old patient lying in the bed with no acute distress. obese EYES: Pupils equal, round, reactive to light and accommodation. No scleral icterus. Extraocular muscles intact.  HEENT: Head atraumatic, normocephalic. Oropharynx and nasopharynx clear.  NECK:  Supple, no jugular venous distention. No thyroid enlargement, no tenderness.  LUNGS: Normal breath sounds bilaterally, no wheezing, rales, rhonchi. No use of accessory muscles of respiration.  CARDIOVASCULAR: S1, S2 normal. No murmurs, rubs, or gallops.  ABDOMEN: Soft, nontender, nondistended. Bowel sounds present. No organomegaly or mass.  EXTREMITIES: No cyanosis, clubbing or edema b/l.    NEUROLOGIC: Cranial nerves II through XII are intact. No focal Motor or sensory deficits b/l.   PSYCHIATRIC: The patient is alert and oriented x 3.  SKIN: No obvious rash, lesion, or ulcer.   LABORATORY PANEL:   CBC  Recent Labs Lab 06/15/16 1045  WBC 7.4  HGB 13.8  HCT 40.3  PLT 280   ------------------------------------------------------------------------------------------------------------------ Chemistries   Recent Labs Lab 06/15/16 1045  NA 137  K 3.5  CL 108  CO2 24  GLUCOSE 94  BUN 11  CREATININE 0.85  CALCIUM 8.8*  AST 30  ALT 26  ALKPHOS 42  BILITOT 0.4   ------------------------------------------------------------------------------------------------------------------  Cardiac Enzymes  Recent Labs Lab 06/15/16 1045  TROPONINI <0.03   ------------------------------------------------------------------------------------------------------------------  RADIOLOGY:  Dg Chest 2 View  Result Date: 06/15/2016 CLINICAL DATA:  Shortness of breath and fever EXAM: CHEST  2 VIEW COMPARISON:  June 13, 2015 FINDINGS: There is no edema or consolidation. Heart size and pulmonary vascularity are normal. No adenopathy. There is upper thoracic levoscoliosis. IMPRESSION: No edema  or  consolidation. Electronically Signed   By: Bretta Bang III M.D.   On: 06/15/2016 11:17     ASSESSMENT AND PLAN:   * Influenza A On Tamiflu Mild fevers  * Vomiting Resolved Some nausea Zofran PRN  * Staph in blood cx liekly contaminant One more day in hospital No abx at this time  All the records are reviewed and case discussed with Care Management/Social Workerr. Management plans discussed with the patient, family and they are in agreement.  CODE STATUS: FULL CODE  DVT Prophylaxis: SCDs  TOTAL TIME TAKING CARE OF THIS PATIENT: 30 minutes.   POSSIBLE D/C IN 1-2 DAYS, DEPENDING ON CLINICAL CONDITION.  Milagros Loll R M.D on 06/16/2016 at 1:42 PM  Between 7am to 6pm - Pager - [phone]  After 6pm go to [email or web address] - password [email or web address] [facility]  [facility] [place] [facility]  Office  [phone]  CC: Primary care physician; No PCP Per Patient  Note: This dictation was prepared with Dragon dictation along with smaller phrase technology. Any transcriptional errors that result from this process are unintentional.

## 2016-06-16 NOTE — Progress Notes (Signed)
PHARMACY - PHYSICIAN COMMUNICATION CRITICAL VALUE ALERT - BLOOD CULTURE IDENTIFICATION (BCID)  Results for orders placed or performed during the hospital encounter of 06/15/16  Blood Culture ID Panel (Reflexed) (Collected: 06/15/2016  2:31 PM)  Result Value Ref Range   Enterococcus species NOT DETECTED NOT DETECTED   Listeria monocytogenes NOT DETECTED NOT DETECTED   Staphylococcus species DETECTED (A) NOT DETECTED   Staphylococcus aureus NOT DETECTED NOT DETECTED   Methicillin resistance NOT DETECTED NOT DETECTED   Streptococcus species NOT DETECTED NOT DETECTED   Streptococcus agalactiae NOT DETECTED NOT DETECTED   Streptococcus pneumoniae NOT DETECTED NOT DETECTED   Streptococcus pyogenes NOT DETECTED NOT DETECTED   Acinetobacter baumannii NOT DETECTED NOT DETECTED   Enterobacteriaceae species NOT DETECTED NOT DETECTED   Enterobacter cloacae complex NOT DETECTED NOT DETECTED   Escherichia coli NOT DETECTED NOT DETECTED   Klebsiella oxytoca NOT DETECTED NOT DETECTED   Klebsiella pneumoniae NOT DETECTED NOT DETECTED   Proteus species NOT DETECTED NOT DETECTED   Serratia marcescens NOT DETECTED NOT DETECTED   Haemophilus influenzae NOT DETECTED NOT DETECTED   Neisseria meningitidis NOT DETECTED NOT DETECTED   Pseudomonas aeruginosa NOT DETECTED NOT DETECTED   Candida albicans NOT DETECTED NOT DETECTED   Candida glabrata NOT DETECTED NOT DETECTED   Candida krusei NOT DETECTED NOT DETECTED   Candida parapsilosis NOT DETECTED NOT DETECTED   Candida tropicalis NOT DETECTED NOT DETECTED    Name of physician (or Provider) Contacted: Dr. Elpidio AnisSudini  Changes to prescribed antibiotics required: None at this time, continue to monitor.  Carola FrostNathan A Reagann Dolce, Pharm.D., BCPS Clinical Pharmacist 06/16/2016  12:00 PM

## 2016-06-17 LAB — HIV ANTIBODY (ROUTINE TESTING W REFLEX): HIV SCREEN 4TH GENERATION: NONREACTIVE

## 2016-06-17 MED ORDER — OSELTAMIVIR PHOSPHATE 75 MG PO CAPS: 75.0000 mg | ORAL_CAPSULE | Freq: Two times a day (BID) | ORAL | 0 refills | Status: DC

## 2016-06-17 NOTE — Progress Notes (Signed)
Pt d/c home; d/c instructions reviewed w/ pt; pt understanding was verbalized; IV removed, catheter in tact, gauze dressing applied; all pt questions answered; pt verbalized that all pt belongings were accounted for; pt left unit via wheelchair accompanied by staff 

## 2016-06-17 NOTE — Discharge Instructions (Addendum)
Return to the emergency having mainly for any worsening symptoms including trouble breathing, shortness of breath, chest pain, nausea, sweats, concern for dehydration such as not making urine, any altered mental status, dizziness or passing out, or any other symptoms concerning to you.  Resume diet and activity as before  Follow all MD discharge instructions. Take all medications as prescribed. Keep all follow up appointments. If your symptoms return, call your doctor. If you experience any new symptoms that are of concern to you or that are bothersome to you, call your doctor. For all questions and/or concerns, call your doctor.   If you have a medical emergency, call 911

## 2016-06-18 LAB — CULTURE, BLOOD (ROUTINE X 2)

## 2016-06-20 LAB — CULTURE, BLOOD (ROUTINE X 2): Culture: NO GROWTH

## 2016-06-20 NOTE — Discharge Summary (Signed)
[facility] [place] [facility]   PATIENT NAME: Hannah Shah    MR#:  [id number]  DATE OF BIRTH:  May 29, 1970  DATE OF ADMISSION:  06/15/2016 ADMITTING PHYSICIAN: Houston Siren, MD  DATE OF DISCHARGE: 06/17/2016 11:46 AM  PRIMARY CARE PHYSICIAN: No PCP Per Patient   ADMISSION DIAGNOSIS:  Flu-like symptoms [R68.89] Influenza [J11.1]  DISCHARGE DIAGNOSIS:  Active Problems:   Flu   SECONDARY DIAGNOSIS:   Past Medical History:  Diagnosis Date  . COPD (chronic obstructive pulmonary disease) (HCC)   . DDD (degenerative disc disease), cervical   . Spinal stenosis      ADMITTING HISTORY  HISTORY OF PRESENT ILLNESS:  Hannah Shah  is a 46 y.o. female with a known history of COPD, degenerative disc disease, spinal stenosis who presents to the hospital due to nausea vomiting and shortness of breath. Patient says that she was in a usual state of health until nausea vomiting and shortness of breath suddenly today. She had 6 episodes of vomiting which was nonbloody and bilious in nature. She started having body aches chills and was just not feeling well and therefore came to the ER further evaluation. In the emergency room patient was noted to be positive for influenza A by PCR. Hospitalist services were contacted further treatment and evaluation.   HOSPITAL COURSE:   Patient was admitted for influenza and nausea with vomiting.  * Influenza A On Tamiflu Mild fevers initially which have resolved. No shortness of breath.  * Acute frontal and maxillary sinusitis with mild headache Improving. Claritin daily.  * Vomiting due to influenza Resolved Some nausea Zofran PRN Tolerating normal diet  * Staph in blood cx likely contaminant . Only one out of 2 sets No abx at this time Normal WBC. Afebrile.  Patient advised to return to emergency room if she has any fever or worsening symptoms.  Stable for discharge home.   CONSULTS OBTAINED:    DRUG  ALLERGIES:   Allergies  Allergen Reactions  . Penicillins Swelling    Has patient had a PCN reaction causing immediate rash, facial/tongue/throat swelling, SOB or lightheadedness with hypotension: yes Has patient had a PCN reaction causing severe rash involving mucus membranes or skin necrosis: yes Has patient had a PCN reaction that required hospitalization no Has patient had a PCN reaction occurring within the last 10 years: no If all of the above answers are "NO", then may proceed with Cephalosporin use.     DISCHARGE MEDICATIONS:   Discharge Medication List as of 06/17/2016  9:09 AM    START taking these medications   Details  oseltamivir (TAMIFLU) 75 MG capsule Take 1 capsule (75 mg total) by mouth 2 (two) times daily., Starting Sat 06/17/2016, Normal      CONTINUE these medications which have NOT CHANGED   Details  albuterol (PROVENTIL HFA;VENTOLIN HFA) 108 (90 Base) MCG/ACT inhaler Inhale 2 puffs into the lungs every 4 (four) hours as needed for wheezing or shortness of breath., Historical Med      STOP taking these medications     azithromycin (ZITHROMAX Z-PAK) 250 MG tablet      carisoprodol (SOMA) 350 MG tablet      cetirizine (ZYRTEC) 10 MG tablet      doxycycline (VIBRA-TABS) 100 MG tablet      fluticasone (FLONASE) 50 MCG/ACT nasal spray      guaiFENesin-codeine 100-10 MG/5ML syrup      meloxicam (MOBIC) 15 MG tablet      predniSONE (  DELTASONE) 10 MG tablet      promethazine (PHENERGAN) 12.5 MG tablet      promethazine (PHENERGAN) 25 MG suppository         Today   VITAL SIGNS:  Blood pressure (!) 111/53, pulse 66, temperature 97.9 F (36.6 C), temperature source Oral, resp. rate 18, height 5\' 4"  (1.626 m), weight 92.5 kg (204 lb), last menstrual period 04/17/2016, SpO2 95 %.  I/O:  No intake or output data in the 24 hours ending 06/20/16 1023  PHYSICAL EXAMINATION:  Physical Exam  GENERAL:  46 y.o.-year-old patient lying in the bed with no  acute distress.  LUNGS: Normal breath sounds bilaterally, no wheezing, rales,rhonchi or crepitation. No use of accessory muscles of respiration.  CARDIOVASCULAR: S1, S2 normal. No murmurs, rubs, or gallops.  ABDOMEN: Soft, non-tender, non-distended. Bowel sounds present. No organomegaly or mass.  NEUROLOGIC: Moves all 4 extremities. PSYCHIATRIC: The patient is alert and oriented x 3.  SKIN: No obvious rash, lesion, or ulcer.   DATA REVIEW:   CBC  Recent Labs Lab 06/15/16 1045  WBC 7.4  HGB 13.8  HCT 40.3  PLT 280    Chemistries   Recent Labs Lab 06/15/16 1045  NA 137  K 3.5  CL 108  CO2 24  GLUCOSE 94  BUN 11  CREATININE 0.85  CALCIUM 8.8*  AST 30  ALT 26  ALKPHOS 42  BILITOT 0.4    Cardiac Enzymes  Recent Labs Lab 06/15/16 1045  TROPONINI <0.03    Microbiology Results  Results for orders placed or performed during the hospital encounter of 06/15/16  Culture, blood (routine x 2)     Status: Abnormal   Collection Time: 06/15/16  2:31 PM  Result Value Ref Range Status   Specimen Description BLOOD  R HAND  Final   Special Requests BOTTLES DRAWN AEROBIC AND ANAEROBIC BCAV  Final   Culture  Setup Time   Final    GRAM POSITIVE COCCI IN BOTH AEROBIC AND ANAEROBIC BOTTLES CRITICAL RESULT CALLED TO, READ BACK BY AND VERIFIED WITH: Nate Cookson @ 1149 06/16/16 by [facility]    Culture (A)  Final    STAPHYLOCOCCUS SPECIES (COAGULASE NEGATIVE) THE SIGNIFICANCE OF ISOLATING THIS ORGANISM FROM A SINGLE SET OF BLOOD CULTURES WHEN MULTIPLE SETS ARE DRAWN IS UNCERTAIN. PLEASE NOTIFY THE MICROBIOLOGY DEPARTMENT WITHIN ONE WEEK IF SPECIATION AND SENSITIVITIES ARE REQUIRED. Performed at [facility], [address] [place], [place] [address]    Report Status 06/18/2016 FINAL  Final  Culture, blood (routine x 2)     Status: None   Collection Time: 06/15/16  2:31 PM  Result Value Ref Range Status   Specimen Description BLOOD L HAND  Final   Special Requests BOTTLES  DRAWN AEROBIC AND ANAEROBIC BCAV  Final   Culture NO GROWTH 5 DAYS  Final   Report Status 06/20/2016 FINAL  Final  Blood Culture ID Panel (Reflexed)     Status: Abnormal   Collection Time: 06/15/16  2:31 PM  Result Value Ref Range Status   Enterococcus species NOT DETECTED NOT DETECTED Final   Listeria monocytogenes NOT DETECTED NOT DETECTED Final   Staphylococcus species DETECTED (A) NOT DETECTED Final    Comment: Methicillin (oxacillin) susceptible coagulase negative staphylococcus. Possible blood culture contaminant (unless isolated from more than one blood culture draw or clinical case suggests pathogenicity). No antibiotic treatment is indicated for blood  culture contaminants. CRITICAL RESULT CALLED TO, READ BACK BY AND VERIFIED WITH: [employer] @  1149 06/16/16 by [facility]    Staphylococcus aureus NOT DETECTED NOT DETECTED Final   Methicillin resistance NOT DETECTED NOT DETECTED Final   Streptococcus species NOT DETECTED NOT DETECTED Final   Streptococcus agalactiae NOT DETECTED NOT DETECTED Final   Streptococcus pneumoniae NOT DETECTED NOT DETECTED Final   Streptococcus pyogenes NOT DETECTED NOT DETECTED Final   Acinetobacter baumannii NOT DETECTED NOT DETECTED Final   Enterobacteriaceae species NOT DETECTED NOT DETECTED Final   Enterobacter cloacae complex NOT DETECTED NOT DETECTED Final   Escherichia coli NOT DETECTED NOT DETECTED Final   Klebsiella oxytoca NOT DETECTED NOT DETECTED Final   Klebsiella pneumoniae NOT DETECTED NOT DETECTED Final   Proteus species NOT DETECTED NOT DETECTED Final   Serratia marcescens NOT DETECTED NOT DETECTED Final   Haemophilus influenzae NOT DETECTED NOT DETECTED Final   Neisseria meningitidis NOT DETECTED NOT DETECTED Final   Pseudomonas aeruginosa NOT DETECTED NOT DETECTED Final   Candida albicans NOT DETECTED NOT DETECTED Final   Candida glabrata NOT DETECTED NOT DETECTED Final   Candida krusei NOT DETECTED NOT DETECTED Final   Candida  parapsilosis NOT DETECTED NOT DETECTED Final   Candida tropicalis NOT DETECTED NOT DETECTED Final    RADIOLOGY:  No results found.  Follow up with PCP in 1 week.  Management plans discussed with the patient, family and they are in agreement.  CODE STATUS:  Code Status History    Date Active Date Inactive Code Status Order ID Comments User Context   06/15/2016  6:39 PM 06/17/2016  2:51 PM Full Code [id number]  Houston SirenVivek J Sainani, MD Inpatient      TOTAL TIME TAKING CARE OF THIS PATIENT ON DAY OF DISCHARGE: more than 30 minutes.   Milagros LollSudini, Yousaf Sainato R M.D on 06/20/2016 at 10:23 AM  Between 7am to 6pm - Pager - [phone]  After 6pm go to [email or web address] - password [email or web address] [email or web address]  [facility]  Office  [phone]  CC: Primary care physician; No PCP Per Patient  Note: This dictation was prepared with Dragon dictation along with smaller phrase technology. Any transcriptional errors that result from this process are unintentional.

## 2017-07-08 ENCOUNTER — Emergency Department: Payer: Disability Insurance

## 2017-07-08 ENCOUNTER — Other Ambulatory Visit: Payer: Self-pay

## 2017-07-08 ENCOUNTER — Encounter: Payer: Self-pay | Admitting: Emergency Medicine

## 2017-07-08 ENCOUNTER — Emergency Department
Admission: EM | Admit: 2017-07-08 | Discharge: 2017-07-09 | Disposition: A | Discharge status: Home / Self Care | Payer: Disability Insurance | Attending: Emergency Medicine | Admitting: Emergency Medicine

## 2017-07-08 DIAGNOSIS — M79671 Pain in right foot: Secondary | ICD-10-CM | POA: Insufficient documentation

## 2017-07-08 DIAGNOSIS — J449 Chronic obstructive pulmonary disease, unspecified: Secondary | ICD-10-CM | POA: Insufficient documentation

## 2017-07-08 DIAGNOSIS — M79672 Pain in left foot: Secondary | ICD-10-CM | POA: Insufficient documentation

## 2017-07-08 DIAGNOSIS — R0789 Other chest pain: Secondary | ICD-10-CM | POA: Insufficient documentation

## 2017-07-08 DIAGNOSIS — R1111 Vomiting without nausea: Secondary | ICD-10-CM | POA: Insufficient documentation

## 2017-07-08 DIAGNOSIS — E860 Dehydration: Secondary | ICD-10-CM | POA: Insufficient documentation

## 2017-07-08 DIAGNOSIS — F17210 Nicotine dependence, cigarettes, uncomplicated: Secondary | ICD-10-CM | POA: Insufficient documentation

## 2017-07-08 DIAGNOSIS — Z79899 Other long term (current) drug therapy: Secondary | ICD-10-CM | POA: Insufficient documentation

## 2017-07-08 HISTORY — DX: Other specified bacterial intestinal infections: A04.8

## 2017-07-08 LAB — BASIC METABOLIC PANEL
Anion gap: 6 (ref 5–15)
BUN: 18 mg/dL (ref 6–20)
CO2: 29 mmol/L (ref 22–32)
Calcium: 9.3 mg/dL (ref 8.9–10.3)
Chloride: 106 mmol/L (ref 101–111)
Creatinine, Ser: 1.14 mg/dL — ABNORMAL HIGH (ref 0.44–1.00)
GFR calc Af Amer: >60 mL/min (ref >60)
GFR calc non Af Amer: 56 mL/min — ABNORMAL LOW (ref >60)
Glucose, Bld: 112 mg/dL — ABNORMAL HIGH (ref 65–99)
POTASSIUM: 3.4 mmol/L — AB (ref 3.5–5.1)
SODIUM: 141 mmol/L (ref 135–145)

## 2017-07-08 LAB — CBC
HEMATOCRIT: 37.9 % (ref 35.0–47.0)
Hemoglobin: 13.3 g/dL (ref 12.0–16.0)
MCH: 32.7 pg (ref 26.0–34.0)
MCHC: 35 g/dL (ref 32.0–36.0)
MCV: 93.4 fL (ref 80.0–100.0)
Platelets: 288 10*3/uL (ref 150–440)
RBC: 4.06 MIL/uL (ref 3.80–5.20)
RDW: 13.3 % (ref 11.5–14.5)
WBC: 9.3 10*3/uL (ref 3.6–11.0)

## 2017-07-08 LAB — TROPONIN I: Troponin I: <0.03 ng/mL (ref <0.03)

## 2017-07-08 LAB — LIPASE, BLOOD: Lipase: 43 U/L (ref 11–51)

## 2017-07-08 LAB — POCT PREGNANCY, URINE: PREG TEST UR: NEGATIVE

## 2017-07-08 MED ORDER — FAMOTIDINE 20 MG PO TABS: 20.0000 mg | ORAL_TABLET | Freq: Two times a day (BID) | ORAL | 0 refills | Status: DC

## 2017-07-08 MED ORDER — METOCLOPRAMIDE HCL 5 MG/ML IJ SOLN
10.0000 mg | Freq: Once | INTRAMUSCULAR | Status: AC
  Administered 2017-07-08: 10 mg via INTRAVENOUS
  Filled 2017-07-08: qty 2

## 2017-07-08 MED ORDER — METOCLOPRAMIDE HCL 10 MG PO TABS: 10.0000 mg | ORAL_TABLET | Freq: Four times a day (QID) | ORAL | 0 refills | Status: DC | PRN

## 2017-07-08 MED ORDER — SODIUM CHLORIDE 0.9 % IV BOLUS
1000.0000 mL | Freq: Once | INTRAVENOUS | Status: AC
  Administered 2017-07-08: 1000 mL via INTRAVENOUS

## 2017-07-08 MED ORDER — ONDANSETRON HCL 4 MG/2ML IJ SOLN
4.0000 mg | Freq: Once | INTRAMUSCULAR | Status: AC
  Administered 2017-07-08: 4 mg via INTRAVENOUS
  Filled 2017-07-08: qty 2

## 2017-07-08 MED ORDER — DIPHENHYDRAMINE HCL 50 MG/ML IJ SOLN
25.0000 mg | Freq: Once | INTRAMUSCULAR | Status: AC
  Administered 2017-07-08: 25 mg via INTRAVENOUS
  Filled 2017-07-08: qty 1

## 2017-07-08 NOTE — ED Notes (Signed)
Patient given water and Malawiturkey sandwich tray and encouraged to eat.

## 2017-07-08 NOTE — ED Triage Notes (Addendum)
Pt presents with several complaints (1) swelling to her feet that started 3 days ago; says feet are burning; no history of peripheral edema; family history of CHF: (2) c/o intermittent pain to the center of her chest x 2-3 days; eases some with changing positions; shortness of breath "slightly" worse than her normal-history of COPD; pt reports feeling dizzy with chest pain intermittently; (3) pt also c/o left upper quadrant pain for 2-3 days; pt says she has N/V/D "every day for all my life"; pt in no acute distress; talking incomplete coherent sentences;

## 2017-07-08 NOTE — ED Provider Notes (Signed)
[facility] Emergency Department Provider Note  ____________________________________________  Time seen: Approximately 11:19 PM  I have reviewed the triage vital signs and the nursing notes.   HISTORY  Chief Complaint Leg Swelling; Chest Pain; and Abdominal Pain    HPI Hannah Shah is a 47 y.o. female with a history of COPD H. pylori a infection and spinal stenosis and chronic recurrent vomiting who complains of burning pain in bilateral heels of the feet for the past 3 days. This is intermittent, nonradiating, no aggravating or alleviating factors. Doesn't hurt to stand or walk on her feet. No trauma. Never had anything like this before.  She also complains of left lower anterior chest pain since yesterday. Nonradiating, worse with movement, better lying in certain positions. Not pleuritic, not exertional. No recent trauma. Described as sharp, fleeting lasting a few seconds at a time.  Also complains of vomiting. She reports that she has had recurrent vomiting her whole life since she was a young child. She has no idea why. She does note a history of H. pylori infection. The vomiting and overall is not new or different for her.     Past Medical History:  Diagnosis Date  . COPD (chronic obstructive pulmonary disease) (HCC)   . DDD (degenerative disc disease), cervical   . H. pylori infection   . Spinal stenosis      Patient Active Problem List   Diagnosis Date Noted  . Flu 06/15/2016     Past Surgical History:  Procedure Laterality Date  . TUBAL LIGATION  1994     Prior to Admission medications   Medication Sig Start Date End Date Taking? Authorizing Provider  albuterol (PROVENTIL HFA;VENTOLIN HFA) 108 (90 Base) MCG/ACT inhaler Inhale 2 puffs into the lungs every 4 (four) hours as needed for wheezing or shortness of breath.   Yes [provider]  Fluticasone-Salmeterol (ADVAIR) 250-50 MCG/DOSE AEPB Inhale 1 puff into the lungs  daily.   Yes [provider]  tiotropium (SPIRIVA) 18 MCG inhalation capsule Place 18 mcg into inhaler and inhale daily.   Yes [provider]  famotidine (PEPCID) 20 MG tablet Take 1 tablet (20 mg total) by mouth 2 (two) times daily. 07/08/17   Sharman Cheek, MD  metoCLOPramide (REGLAN) 10 MG tablet Take 1 tablet (10 mg total) by mouth every 6 (six) hours as needed. 07/08/17   Sharman Cheek, MD     Allergies Penicillins   Family History  Problem Relation Age of Onset  . Diabetes Mother   . Heart failure Mother   . Heart attack Father     Social History Social History   Tobacco Use  . Smoking status: Current Every Day Smoker    Packs/day: 0.50    Years: 30.00    Pack years: 15.00  . Smokeless tobacco: Never Used  Substance Use Topics  . Alcohol use: No  . Drug use: No    Review of Systems  Constitutional:   No fever or chills.  ENT:   No sore throat. No rhinorrhea. Cardiovascular:  atypical chest pain as above without syncope. Respiratory:   No dyspnea or cough. Gastrointestinal:   Negative for abdominal pain, positive for recurrent chronic vomiting.  Musculoskeletal:   positive as above for bilateral heel pain All other systems reviewed and are negative except as documented above in ROS and HPI.  ____________________________________________   PHYSICAL EXAM:  VITAL SIGNS: ED Triage Vitals  Enc Vitals Group     BP 07/08/17  2234 122/62     Pulse Rate 07/08/17 1953 94     Resp 07/08/17 1953 18     Temp --      Temp Source 07/08/17 1953 Oral     SpO2 07/08/17 1953 99 %     Weight 07/08/17 1954 208 lb (94.3 kg)     Height 07/08/17 1954 5' 3.5" (1.613 m)     Head Circumference --      Peak Flow --      Pain Score 07/08/17 1953 8     Pain Loc --      Pain Edu? --      Excl. in GC? --     Vital signs reviewed, nursing assessments reviewed.   Constitutional:   Alert and oriented. Well appearing and in no distress. Eyes:   Conjunctivae  are normal. EOMI. PERRL. ENT      Head:   Normocephalic and atraumatic.      Nose:   No congestion/rhinnorhea.       Mouth/Throat:   somewhat dry mucous membranes, no pharyngeal erythema. No peritonsillar mass.       Neck:   No meningismus. Full ROM. Hematological/Lymphatic/Immunilogical:   No cervical lymphadenopathy. Cardiovascular:   RRR. Symmetric bilateral radial and DP pulses.  No murmurs.  Respiratory:   Normal respiratory effort without tachypnea/retractions. Breath sounds are clear and equal bilaterally. No wheezes/rales/rhonchi. Gastrointestinal:   Soft and nontender. Non distended. There is no CVA tenderness.  No rebound, rigidity, or guarding. Genitourinary:   deferred Musculoskeletal:   Normal range of motion in all extremities. No joint effusions.  No lower extremity tenderness.  No edema.bilateral heels nontender. Intact Achilles tendon function. Normal sensation. No bony instability or tenderness. Chest wall is stable and tender to the touch over the left anterior inferior ribs reproducing her pain. Neurologic:   Normal speech and language.  Motor grossly intact. No acute focal neurologic deficits are appreciated.  Skin:    Skin is warm, dry and intact. No rash noted.  No petechiae, purpura, or bullae.  ____________________________________________    LABS (pertinent positives/negatives) (all labs ordered are listed, but only abnormal results are displayed) Labs Reviewed  BASIC METABOLIC PANEL - Abnormal; Notable for the following components:      Result Value   Potassium 3.4 (*)    Glucose, Bld 112 (*)    Creatinine, Ser 1.14 (*)    GFR calc non Af Amer 56 (*)    All other components within normal limits  CBC  TROPONIN I  LIPASE, BLOOD  POC URINE PREG, ED  POCT PREGNANCY, URINE   ____________________________________________   EKG  interpreted by me Normal sinus rhythm rate of 84, normal axis intervals QRS ST segments and T  waves.  ____________________________________________    RADIOLOGY  Dg Chest 2 View  Result Date: 07/08/2017 CLINICAL DATA:  Intermittent pain to the chest for 3 days. EXAM: CHEST - 2 VIEW COMPARISON:  June 15, 2016 FINDINGS: The heart size and mediastinal contours are within normal limits. There is no focal infiltrate, pulmonary edema, or pleural effusion. The visualized skeletal structures are stable. IMPRESSION: No active cardiopulmonary disease. Electronically Signed   By: Sherian ReinWei-Chen  Lin M.D.   On: 07/08/2017 20:47    ____________________________________________   PROCEDURES Procedures  ____________________________________________  DIFFERENTIAL DIAGNOSIS   chest wall pain, intercostal strain secondary to vomiting, GERD, gastritis, nutritional deficiency, dehydration  CLINICAL IMPRESSION / ASSESSMENT AND PLAN / ED COURSE  Pertinent labs & imaging results that were  available during my care of the patient were reviewed by me and considered in my medical decision making (see chart for details).    patient well-appearing no acute distress, normal vital signs, presents with multiple complaints. It's unclear how to tie all these complaints together into 1 unifying syndrome. I think that she has chest wall pain which is likely an intercostal strain from her vomiting. Vomiting may also be causing some dehydration which is producing other unusual symptoms. The heel pain doesn't appear to be due to neuropathy or severe nutritional deficiency such as B12 or folate. She has no cerebellar symptoms or other neurologic dysfunction.  Plan to control symptoms with IV fluid hydration, Reglan and Benadryl. Patient should be suitable for discharge home after her symptoms are improved to follow up with primary care. I doubt she is having a stroke or demyelinating disease or a spinal cord syndrome. No evidence of any significant intraabdominal pathology with a reassuring exam.  I highly doubt mesenteric  ischemia AAA or dissection.      ____________________________________________   FINAL CLINICAL IMPRESSION(S) / ED DIAGNOSES    Final diagnoses:  Mild dehydration  Vomiting without nausea, intractability of vomiting not specified, unspecified vomiting type  Pain in both feet  Chest wall pain     ED Discharge Orders        Ordered    famotidine (PEPCID) 20 MG tablet  2 times daily     07/08/17 2319    metoCLOPramide (REGLAN) 10 MG tablet  Every 6 hours PRN     07/08/17 2319      Portions of this note were generated with dragon dictation software. Dictation errors may occur despite best attempts at proofreading.    Sharman Cheek, MD 07/08/17 2325

## 2017-07-08 NOTE — Discharge Instructions (Addendum)
Take Pepcid and Reglan as prescribed. Return to the ER for worsening symptoms, persistent vomiting, difficulty breathing or other concerns.

## 2017-07-09 NOTE — ED Notes (Signed)
Patient resting quietly with eyes closed

## 2017-07-09 NOTE — ED Notes (Signed)
Patient tolerated water and Malawiturkey sandwich. Patient resting with eyes closed.

## 2017-07-09 NOTE — ED Provider Notes (Signed)
-----------------------------------------   1:37 AM on 07/09/2017 -----------------------------------------  Patient sleeping in no acute distress purely feeling overall improved.  Will discharge home per Dr. Charmian MuffStafford's instructions and prescriptions.  Strict return precautions given.  Patient verbalizes understanding and agrees with plan of care.   Irean HongSung, Jade J, MD 07/09/17 [phone]

## 2017-09-23 ENCOUNTER — Emergency Department
Admission: EM | Admit: 2017-09-23 | Discharge: 2017-09-23 | Disposition: A | Discharge status: Home / Self Care | Payer: Self-pay | Attending: Emergency Medicine | Admitting: Emergency Medicine

## 2017-09-23 ENCOUNTER — Encounter: Payer: Self-pay | Admitting: Emergency Medicine

## 2017-09-23 DIAGNOSIS — R112 Nausea with vomiting, unspecified: Secondary | ICD-10-CM | POA: Insufficient documentation

## 2017-09-23 DIAGNOSIS — J449 Chronic obstructive pulmonary disease, unspecified: Secondary | ICD-10-CM | POA: Insufficient documentation

## 2017-09-23 DIAGNOSIS — E860 Dehydration: Secondary | ICD-10-CM | POA: Insufficient documentation

## 2017-09-23 DIAGNOSIS — F17210 Nicotine dependence, cigarettes, uncomplicated: Secondary | ICD-10-CM | POA: Insufficient documentation

## 2017-09-23 LAB — CBC
HEMATOCRIT: 39.6 % (ref 35.0–47.0)
Hemoglobin: 13.9 g/dL (ref 12.0–16.0)
MCH: 33 pg (ref 26.0–34.0)
MCHC: 35.2 g/dL (ref 32.0–36.0)
MCV: 93.8 fL (ref 80.0–100.0)
Platelets: 238 10*3/uL (ref 150–440)
RBC: 4.23 MIL/uL (ref 3.80–5.20)
RDW: 13.8 % (ref 11.5–14.5)
WBC: 8 10*3/uL (ref 3.6–11.0)

## 2017-09-23 LAB — COMPREHENSIVE METABOLIC PANEL
ALBUMIN: 4 g/dL (ref 3.5–5.0)
ALT: 32 U/L (ref 14–54)
AST: 31 U/L (ref 15–41)
Alkaline Phosphatase: 43 U/L (ref 38–126)
Anion gap: 8 (ref 5–15)
BILIRUBIN TOTAL: 0.3 mg/dL (ref 0.3–1.2)
BUN: 14 mg/dL (ref 6–20)
CHLORIDE: 108 mmol/L (ref 101–111)
CO2: 22 mmol/L (ref 22–32)
CREATININE: 0.97 mg/dL (ref 0.44–1.00)
Calcium: 8.9 mg/dL (ref 8.9–10.3)
GFR calc Af Amer: >60 mL/min (ref >60)
GLUCOSE: 95 mg/dL (ref 65–99)
POTASSIUM: 3.5 mmol/L (ref 3.5–5.1)
Sodium: 138 mmol/L (ref 135–145)
TOTAL PROTEIN: 6.6 g/dL (ref 6.5–8.1)

## 2017-09-23 LAB — LIPASE, BLOOD: LIPASE: 39 U/L (ref 11–51)

## 2017-09-23 LAB — TROPONIN I: Troponin I: <0.03 ng/mL (ref <0.03)

## 2017-09-23 MED ORDER — SODIUM CHLORIDE 0.9 % IV BOLUS
1000.0000 mL | Freq: Once | INTRAVENOUS | Status: AC
  Administered 2017-09-23: 1000 mL via INTRAVENOUS

## 2017-09-23 MED ORDER — ONDANSETRON HCL 4 MG/2ML IJ SOLN
4.0000 mg | Freq: Once | INTRAMUSCULAR | Status: AC
  Administered 2017-09-23: 4 mg via INTRAVENOUS
  Filled 2017-09-23: qty 2

## 2017-09-23 NOTE — ED Triage Notes (Signed)
[facility] and stated she has only had one bottle of water.  Pt reports vomiting 6x, and pulled her car over to call EMS.  EMS gave pt 4mg  IV Zofran [facility], and 100mL 0.9% NS.  Pt AOx4 during triage.  Pt was ambulatory per EMS.  Pt has hx of COPD and Asthma but has no complaints with those, nor any pain complaints.  12L EKG was unremarkable per EMS.

## 2017-09-23 NOTE — ED Provider Notes (Signed)
[facility] Emergency Department Provider Note  ____________________________________________  Time seen: Approximately 8:42 PM  I have reviewed the triage vital signs and the nursing notes.   HISTORY  Chief Complaint Dehydration   HPI Hannah Shah is a 47 y.o. female with a history of GERD, H. pylori, cyclic vomiting, COPD who presents for evaluation of dehydration.  Patient reports that she had a very active day today.  She spent most of her day packing to move.  She then went to her grandson's birthday party at the park.  Was very hot outside and she did not really drink much fluids.  Towards the end of the day she started feeling really hot and sweaty, she also felt dizzy like she was going to pass out especially when she stood up and was walking.  She went to the water fountain at the park and threw some water in her face.  When she was walking back she felt dizzy again.  She reports 6 episodes of nonbloody nonbilious emesis today.  Patient reports that she vomits every day of her life and this is a chronic issue for her. She has been evaluated by her doctor and GI doctor for this issue.  She denies abdominal pain, chest pain, fever, chills, dysuria, hematuria, constipation, diarrhea, URI symptoms, shortness of breath.  Patient reports that she feels slightly improved at this time.  Past Medical History:  Diagnosis Date  . COPD (chronic obstructive pulmonary disease) (HCC)   . DDD (degenerative disc disease), cervical   . H. pylori infection   . Spinal stenosis     Patient Active Problem List   Diagnosis Date Noted  . Flu 06/15/2016    Past Surgical History:  Procedure Laterality Date  . TUBAL LIGATION  1994  . TUBAL LIGATION      Prior to Admission medications   Medication Sig Start Date End Date Taking? Authorizing Provider  albuterol (PROVENTIL HFA;VENTOLIN HFA) 108 (90 Base) MCG/ACT inhaler Inhale 2 puffs into the lungs every 4 (four)  hours as needed for wheezing or shortness of breath.    [provider]  famotidine (PEPCID) 20 MG tablet Take 1 tablet (20 mg total) by mouth 2 (two) times daily. 07/08/17   Sharman Cheek, MD  Fluticasone-Salmeterol (ADVAIR) 250-50 MCG/DOSE AEPB Inhale 1 puff into the lungs daily.    [provider]  metoCLOPramide (REGLAN) 10 MG tablet Take 1 tablet (10 mg total) by mouth every 6 (six) hours as needed. 07/08/17   Sharman Cheek, MD  tiotropium (SPIRIVA) 18 MCG inhalation capsule Place 18 mcg into inhaler and inhale daily.    [provider]    Allergies Penicillins  Family History  Problem Relation Age of Onset  . Diabetes Mother   . Heart failure Mother   . Heart attack Father     Social History Social History   Tobacco Use  . Smoking status: Current Every Day Smoker    Packs/day: 0.50    Years: 30.00    Pack years: 15.00  . Smokeless tobacco: Never Used  Substance Use Topics  . Alcohol use: No  . Drug use: No    Review of Systems  Constitutional: Negative for fever. + Lightheadedness Eyes: Negative for visual changes. ENT: Negative for sore throat. Neck: No neck pain  Cardiovascular: Negative for chest pain. Respiratory: Negative for shortness of breath. Gastrointestinal: Negative for abdominal pain or diarrhea. + N/V Genitourinary: Negative for dysuria. Musculoskeletal: Negative for back pain. Skin:  Negative for rash. Neurological: Negative for headaches, weakness or numbness. Psych: No SI or HI  ____________________________________________   PHYSICAL EXAM:  VITAL SIGNS: ED Triage Vitals  Enc Vitals Group     BP 09/23/17 2029 120/71     Pulse Rate 09/23/17 2029 74     Resp 09/23/17 2029 18     Temp 09/23/17 2029 97.8 F (36.6 C)     Temp Source 09/23/17 2029 Oral     SpO2 09/23/17 2029 99 %     Weight --      Height --      Head Circumference --      Peak Flow --      Pain Score 09/23/17 2030 0     Pain Loc --       Pain Edu? --      Excl. in GC? --     Constitutional: Alert and oriented. Well appearing and in no apparent distress. HEENT:      Head: Normocephalic and atraumatic.         Eyes: Conjunctivae are normal. Sclera is non-icteric.       Mouth/Throat: Mucous membranes are moist.       Neck: Supple with no signs of meningismus. Cardiovascular: Regular rate and rhythm. No murmurs, gallops, or rubs. 2+ symmetrical distal pulses are present in all extremities. No JVD. Respiratory: Normal respiratory effort. Lungs are clear to auscultation bilaterally. No wheezes, crackles, or rhonchi.  Gastrointestinal: Soft, non tender, and non distended with positive bowel sounds. No rebound or guarding. Musculoskeletal: Nontender with normal range of motion in all extremities. No edema, cyanosis, or erythema of extremities. Neurologic: Normal speech and language. Face is symmetric. Moving all extremities. No gross focal neurologic deficits are appreciated. Skin: Skin is warm, dry and intact. No rash noted. Psychiatric: Mood and affect are normal. Speech and behavior are normal.  ____________________________________________   LABS (all labs ordered are listed, but only abnormal results are displayed)  Labs Reviewed  CBC  COMPREHENSIVE METABOLIC PANEL  LIPASE, BLOOD  TROPONIN I  URINALYSIS, COMPLETE (UACMP) WITH MICROSCOPIC   ____________________________________________  EKG  ED ECG REPORT I, Nita Sickle, the attending physician, personally viewed and interpreted this ECG.  Normal sinus rhythm, rate of 70, normal intervals, normal axis, no ST elevations or depressions.  Normal EKG. ____________________________________________  RADIOLOGY  none  ____________________________________________   PROCEDURES  Procedure(s) performed: None Procedures Critical Care performed:  None ____________________________________________   INITIAL IMPRESSION / ASSESSMENT AND PLAN / ED COURSE   47 y.o.  female with a history of GERD, H. pylori, cyclic vomiting, COPD who presents for evaluation of dehydration.  Patient is extremely well-appearing, no distress, has normal vital signs, physical exam with no acute findings.  Patient complaining of lightheadedness in the setting of having a very busy day in the heat, not drinking plenty of fluids, and 6 episodes of chronic emesis.  We will do an EKG to rule out arrhythmia or ischemia, labs to rule out acute kidney injury, electrolyte abnormalities, dehydration, anemia, UA to rule out UTI.  Will give IV fluids and Zofran.  Will monitor on telemetry.    _________________________ 9:48 PM on 09/23/2017 -----------------------------------------  Labs showing no acute findings.  Patient is tolerating p.o.  Patient feels back to her baseline.  At this time with normal labs, normal vitals, and patient feeling back to her baseline I believe she is stable for discharge home.  Recommended increase hydration and follow-up with primary care doctor.  Discussed  return precautions with patient.   As part of my medical decision making, I reviewed the following data within the electronic MEDICAL RECORD NUMBER Nursing notes reviewed and incorporated, Labs reviewed , EKG interpreted , Old chart reviewed, Notes from prior ED visits and [place] Controlled Substance Database    Pertinent labs & imaging results that were available during my care of the patient were reviewed by me and considered in my medical decision making (see chart for details).    ____________________________________________   FINAL CLINICAL IMPRESSION(S) / ED DIAGNOSES  Final diagnoses:  Dehydration  Non-intractable vomiting with nausea, unspecified vomiting type      NEW MEDICATIONS STARTED DURING THIS VISIT:  ED Discharge Orders    None       Note:  This document was prepared using Dragon voice recognition software and may include unintentional dictation errors.    Don PerkingVeronese, [place],  MD 09/23/17 2150

## 2017-11-24 ENCOUNTER — Encounter: Payer: Self-pay | Admitting: Emergency Medicine

## 2017-11-24 ENCOUNTER — Emergency Department
Admission: EM | Admit: 2017-11-24 | Discharge: 2017-11-24 | Disposition: A | Discharge status: Home / Self Care | Payer: Self-pay | Attending: Emergency Medicine | Admitting: Emergency Medicine

## 2017-11-24 DIAGNOSIS — Z207 Contact with and (suspected) exposure to pediculosis, acariasis and other infestations: Secondary | ICD-10-CM | POA: Insufficient documentation

## 2017-11-24 DIAGNOSIS — H9203 Otalgia, bilateral: Secondary | ICD-10-CM

## 2017-11-24 DIAGNOSIS — H6123 Impacted cerumen, bilateral: Secondary | ICD-10-CM | POA: Insufficient documentation

## 2017-11-24 DIAGNOSIS — Z2089 Contact with and (suspected) exposure to other communicable diseases: Secondary | ICD-10-CM

## 2017-11-24 MED ORDER — HYDROXYZINE HCL 50 MG PO TABS
50.0000 mg | ORAL_TABLET | Freq: Once | ORAL | Status: AC
  Administered 2017-11-24: 50 mg via ORAL
  Filled 2017-11-24: qty 1

## 2017-11-24 MED ORDER — PERMETHRIN 5 % EX CREA: 1.0000 "application " | TOPICAL_CREAM | Freq: Once | CUTANEOUS | 0 refills | Status: AC

## 2017-11-24 MED ORDER — HYDROXYZINE HCL 50 MG PO TABS: 50.0000 mg | ORAL_TABLET | Freq: Three times a day (TID) | ORAL | 0 refills | Status: DC | PRN

## 2017-11-24 NOTE — ED Triage Notes (Signed)
Patient presents to the ED with a complaint of bilateral ear pain x 4 days and rash to upper back x 2 days.  Patient denies fever or neck pain other than chronic degenerative disc disease.  Patient is in no obvious distress at this time.

## 2017-11-24 NOTE — ED Notes (Signed)

## 2017-11-24 NOTE — ED Provider Notes (Signed)
[facility] Emergency Department Provider Note   ____________________________________________   First MD Initiated Contact with Patient 11/24/17 1616     (approximate)  I have reviewed the triage vital signs and the nursing notes.   HISTORY  Chief Complaint Otalgia and Rash    HPI Hannah Shah is a 47 y.o. female patient presents with bilateral ear pain and decreased hearing for 4 days.  Patient has a history of sinus congestion.  Patient also complain a rash at upper back for 2 days.  Patient states she is concerned because 2 family members are also have been diagnosed and treated for scabies.  Patient rates her pain discomfort as a 9/10.  Patient describes her pain is "achy".  No palliative measures for either complaint.  Past Medical History:  Diagnosis Date  . COPD (chronic obstructive pulmonary disease) (HCC)   . DDD (degenerative disc disease), cervical   . H. pylori infection   . Spinal stenosis     Patient Active Problem List   Diagnosis Date Noted  . Flu 06/15/2016    Past Surgical History:  Procedure Laterality Date  . TUBAL LIGATION  1994  . TUBAL LIGATION      Prior to Admission medications   Medication Sig Start Date End Date Taking? Authorizing Provider  albuterol (PROVENTIL HFA;VENTOLIN HFA) 108 (90 Base) MCG/ACT inhaler Inhale 2 puffs into the lungs every 4 (four) hours as needed for wheezing or shortness of breath.    [provider]  famotidine (PEPCID) 20 MG tablet Take 1 tablet (20 mg total) by mouth 2 (two) times daily. 07/08/17   Sharman Cheek, MD  Fluticasone-Salmeterol (ADVAIR) 250-50 MCG/DOSE AEPB Inhale 1 puff into the lungs daily.    [provider]  hydrOXYzine (ATARAX/VISTARIL) 50 MG tablet Take 1 tablet (50 mg total) by mouth 3 (three) times daily as needed. 11/24/17   Joni Reining, PA-C  metoCLOPramide (REGLAN) 10 MG tablet Take 1 tablet (10 mg total) by mouth every 6 (six) hours as  needed. 07/08/17   Sharman Cheek, MD  permethrin (ELIMITE) 5 % cream Apply 1 application topically once for 1 dose. 11/24/17 11/24/17  Joni Reining, PA-C  tiotropium (SPIRIVA) 18 MCG inhalation capsule Place 18 mcg into inhaler and inhale daily.    [provider]    Allergies Penicillins  Family History  Problem Relation Age of Onset  . Diabetes Mother   . Heart failure Mother   . Heart attack Father     Social History Social History   Tobacco Use  . Smoking status: Current Every Day Smoker    Packs/day: 0.50    Years: 30.00    Pack years: 15.00  . Smokeless tobacco: Never Used  Substance Use Topics  . Alcohol use: No  . Drug use: No    Review of Systems  Constitutional: No fever/chills Eyes: No visual changes. ENT: No sore throat.  Bilateral ear pain and decreased hearing. Cardiovascular: Denies chest pain. Respiratory: Denies shortness of breath. Gastrointestinal: No abdominal pain.  No nausea, no vomiting.  No diarrhea.  No constipation. Genitourinary: Negative for dysuria. Musculoskeletal: Negative for back pain. Skin: Positive for rash. Neurological: Negative for headaches, focal weakness or numbness. Allergic/Immunilogical: Penicillin ____________________________________________   PHYSICAL EXAM:  VITAL SIGNS: ED Triage Vitals  Enc Vitals Group     BP 11/24/17 1600 (!) 120/58     Pulse Rate 11/24/17 1600 87     Resp 11/24/17 1600 16  Temp 11/24/17 1600 98.1 F (36.7 C)     Temp Source 11/24/17 1600 Oral     SpO2 11/24/17 1600 99 %     Weight 11/24/17 1601 183 lb (83 kg)     Height 11/24/17 1601 5\' 3"  (1.6 m)     Head Circumference --      Peak Flow --      Pain Score 11/24/17 1600 9     Pain Loc --      Pain Edu? --      Excl. in GC? --     Constitutional: Alert and oriented. Well appearing and in no acute distress. Eyes: Conjunctivae are normal. PERRL. EOMI. Head: Atraumatic. Nose: No congestion/rhinnorhea. Mouth/Throat:  Mucous membranes are moist.  Oropharynx non-erythematous. Neck: No stridor.  No cervical spine tenderness to palpation. Hematological/Lymphatic/Immunilogical: No cervical lymphadenopathy. Cardiovascular: Normal rate, regular rhythm. Grossly normal heart sounds.  Good peripheral circulation. Respiratory: Normal respiratory effort.  No retractions. Lungs CTAB. Gastrointestinal: Soft and nontender. No distention. No abdominal bruits. No CVA tenderness. Skin:  Skin is warm, dry and intact.  Papular lesion anterior posterior trunk.  Psychiatric: Mood and affect are normal. Speech and behavior are normal.  ____________________________________________   LABS (all labs ordered are listed, but only abnormal results are displayed)  Labs Reviewed - No data to display ____________________________________________  EKG   ____________________________________________  RADIOLOGY  ED MD interpretation:    Official radiology report(s): No results found.  ____________________________________________   PROCEDURES  Procedure(s) performed: None  Procedures  Critical Care performed: No  ____________________________________________   INITIAL IMPRESSION / ASSESSMENT AND PLAN / ED COURSE  As part of my medical decision making, I reviewed the following data within the electronic MEDICAL RECORD NUMBER    Bilateral ear pain decreased hearing secondary to cerebral impaction.  Complaint will resolve with irrigation.  Patient also has a rash consistent with a papular lesions with history contact with scabies.  Patient given discharge care instructions.  Patient given a prescription for Atarax and Elimite.  [facility]      ____________________________________________   FINAL CLINICAL IMPRESSION(S) / ED DIAGNOSES  Final diagnoses:  Cerumen debris on tympanic membrane, bilateral  Otalgia of both ears  Exposure to scabies     ED Discharge Orders          Ordered    hydrOXYzine (ATARAX/VISTARIL) 50 MG tablet  3 times daily PRN     11/24/17 1735    permethrin (ELIMITE) 5 % cream   Once     11/24/17 1735           Note:  This document was prepared using Dragon voice recognition software and may include unintentional dictation errors.    Joni ReiningSmith, Shiah Berhow K, PA-C 11/24/17 1737    Nita SickleVeronese, [place], MD 11/29/17 1434

## 2018-03-24 ENCOUNTER — Encounter: Payer: Self-pay | Admitting: Emergency Medicine

## 2018-03-24 ENCOUNTER — Emergency Department
Admission: EM | Admit: 2018-03-24 | Discharge: 2018-03-24 | Disposition: A | Discharge status: Home / Self Care | Payer: Self-pay | Attending: Emergency Medicine | Admitting: Emergency Medicine

## 2018-03-24 DIAGNOSIS — F17210 Nicotine dependence, cigarettes, uncomplicated: Secondary | ICD-10-CM | POA: Insufficient documentation

## 2018-03-24 DIAGNOSIS — H9202 Otalgia, left ear: Secondary | ICD-10-CM | POA: Insufficient documentation

## 2018-03-24 DIAGNOSIS — R110 Nausea: Secondary | ICD-10-CM | POA: Insufficient documentation

## 2018-03-24 DIAGNOSIS — B349 Viral infection, unspecified: Secondary | ICD-10-CM | POA: Insufficient documentation

## 2018-03-24 DIAGNOSIS — J449 Chronic obstructive pulmonary disease, unspecified: Secondary | ICD-10-CM | POA: Insufficient documentation

## 2018-03-24 LAB — INFLUENZA PANEL BY PCR (TYPE A & B)
INFLBPCR: NEGATIVE
Influenza A By PCR: NEGATIVE

## 2018-03-24 MED ORDER — GUAIFENESIN-CODEINE 100-10 MG/5ML PO SOLN: 5.0000 mL | Freq: Four times a day (QID) | ORAL | 0 refills | Status: DC | PRN

## 2018-03-24 NOTE — ED Provider Notes (Signed)
[facility] Emergency Department Provider Note  ____________________________________________   None    (approximate)  I have reviewed the triage vital signs and the nursing notes.   HISTORY  Chief Complaint Generalized Body Aches; Facial Pain; Otalgia; and Fever   HPI Hannah CooperCarol J Shah is a 47 y.o. female since to the ED with complaint of flulike symptoms along with body aches, nausea, left ear pain and facial pain around her sinuses for the last 2 days.  Patient denies any shortness of breath or chest pain.  She denies any chills, vomiting or diarrhea.  Patient been taking over-the-counter medication.  Patient is worried that she has influenza she states that her grandchildren have similar symptoms but was not tested for influenza.   Past Medical History:  Diagnosis Date  . COPD (chronic obstructive pulmonary disease) (HCC)   . DDD (degenerative disc disease), cervical   . H. pylori infection   . Spinal stenosis     Patient Active Problem List   Diagnosis Date Noted  . Flu 06/15/2016    Past Surgical History:  Procedure Laterality Date  . TUBAL LIGATION  1994  . TUBAL LIGATION      Prior to Admission medications   Medication Sig Start Date End Date Taking? Authorizing Provider  albuterol (PROVENTIL HFA;VENTOLIN HFA) 108 (90 Base) MCG/ACT inhaler Inhale 2 puffs into the lungs every 4 (four) hours as needed for wheezing or shortness of breath.    [provider]  Fluticasone-Salmeterol (ADVAIR) 250-50 MCG/DOSE AEPB Inhale 1 puff into the lungs daily.    [provider]  guaiFENesin-codeine 100-10 MG/5ML syrup Take 5 mLs by mouth every 6 (six) hours as needed for cough. 03/24/18   Tommi RumpsSummers, Evaline Waltman L, PA-C  tiotropium (SPIRIVA) 18 MCG inhalation capsule Place 18 mcg into inhaler and inhale daily.    [provider]    Allergies Penicillins  Family History  Problem Relation Age of Onset  . Diabetes Mother   . Heart  failure Mother   . Heart attack Father     Social History Social History   Tobacco Use  . Smoking status: Current Every Day Smoker    Packs/day: 0.50    Years: 30.00    Pack years: 15.00  . Smokeless tobacco: Never Used  Substance Use Topics  . Alcohol use: No  . Drug use: No    Review of Systems Constitutional: Subjective fever/chills Eyes: No visual changes. ENT: No sore throat. Cardiovascular: Denies chest pain. Respiratory: Denies shortness of breath.  Positive cough. Gastrointestinal: No abdominal pain.  Positive nausea, no vomiting.  No diarrhea.  Genitourinary: Negative for dysuria. Musculoskeletal: Positive body aches. Skin: Negative for rash. Neurological: Negative for headaches, focal weakness or numbness. ____________________________________________   PHYSICAL EXAM:  VITAL SIGNS: ED Triage Vitals  Enc Vitals Group     BP 03/24/18 1017 119/73     Pulse Rate 03/24/18 1017 78     Resp 03/24/18 1017 16     Temp 03/24/18 1017 98.2 F (36.8 C)     Temp Source 03/24/18 1017 Oral     SpO2 03/24/18 1017 95 %     Weight 03/24/18 1000 280 lb (127 kg)     Height 03/24/18 1000 5\' 4"  (1.626 m)     Head Circumference --      Peak Flow --      Pain Score --      Pain Loc --      Pain Edu? --  Excl. in GC? --    Constitutional: Alert and oriented. Well appearing and in no acute distress. Eyes: Conjunctivae are normal. PERRL. EOMI. Head: Atraumatic. Nose: Minimal congestion/rhinnorhea. Mouth/Throat: Mucous membranes are moist.  Oropharynx non-erythematous. Neck: No stridor.   Hematological/Lymphatic/Immunilogical: No cervical lymphadenopathy. Cardiovascular: Normal rate, regular rhythm. Grossly normal heart sounds.  Good peripheral circulation. Respiratory: Normal respiratory effort.  No retractions. Lungs CTAB. Musculoskeletal: Moves upper and lower extremities with any difficulty normal gait was noted. Neurologic:  Normal speech and language. No gross  focal neurologic deficits are appreciated. No gait instability. Skin:  Skin is warm, dry and intact. No rash noted. Psychiatric: Mood and affect are normal. Speech and behavior are normal.  ____________________________________________   LABS (all labs ordered are listed, but only abnormal results are displayed)  Labs Reviewed  INFLUENZA PANEL BY PCR (TYPE A & B)    PROCEDURES  Procedure(s) performed: None  Procedures  Critical Care performed: No  ____________________________________________   INITIAL IMPRESSION / ASSESSMENT AND PLAN / ED COURSE  As part of my medical decision making, I reviewed the following data within the electronic MEDICAL RECORD NUMBER Notes from prior ED visits and [place] Controlled Substance Database  Patient presents to the ED with complaint of flulike symptoms along with body aches, nausea left ear pain and cough for the last 2 days.  She is concerned that she has influenza as her grandchildren had similar symptoms but was not tested for influenza.  Patient was reassured when her test results were negative.  She was given a prescription for Robitussin-AC as needed for cough.  She is to increase fluids and take Tylenol or ibuprofen if needed for body aches.  She will follow-up with her PCP if any continued problems.  ____________________________________________   FINAL CLINICAL IMPRESSION(S) / ED DIAGNOSES  Final diagnoses:  Viral illness     ED Discharge Orders         Ordered    guaiFENesin-codeine 100-10 MG/5ML syrup  Every 6 hours PRN     03/24/18 1226           Note:  This document was prepared using Dragon voice recognition software and may include unintentional dictation errors.    Tommi RumpsSummers, Wendall Isabell L, PA-C 03/24/18 1528    Sharyn CreamerQuale, Mark, MD 03/24/18 1535

## 2018-03-24 NOTE — ED Triage Notes (Signed)
Pt reports flu-like sx's, fever, bodyaches, nausea, left ear pain, face pain around her sinuses and cough for the past 2 days. Denies SOB or CP.

## 2018-03-24 NOTE — Discharge Instructions (Addendum)
Follow-up with your primary care provider or can no clinic acute care if any continued problems.  Increase fluids.  Tylenol or ibuprofen as needed for body aches and fever.  Also Robitussin-AC for cough and congestion.  This medication contains a narcotic.  Do not drive and operate machinery while taking the cough medication.

## 2018-03-24 NOTE — ED Notes (Signed)
See triage note  Presents with body aches ,left ear pain and subjective fever at home but afebrile on arrival

## 2019-11-08 ENCOUNTER — Other Ambulatory Visit: Payer: Self-pay

## 2019-11-08 ENCOUNTER — Emergency Department: Payer: Self-pay

## 2019-11-08 ENCOUNTER — Encounter: Payer: Self-pay | Admitting: Emergency Medicine

## 2019-11-08 DIAGNOSIS — J449 Chronic obstructive pulmonary disease, unspecified: Secondary | ICD-10-CM | POA: Insufficient documentation

## 2019-11-08 DIAGNOSIS — R1032 Left lower quadrant pain: Secondary | ICD-10-CM | POA: Insufficient documentation

## 2019-11-08 DIAGNOSIS — R5383 Other fatigue: Secondary | ICD-10-CM | POA: Insufficient documentation

## 2019-11-08 DIAGNOSIS — F17200 Nicotine dependence, unspecified, uncomplicated: Secondary | ICD-10-CM | POA: Insufficient documentation

## 2019-11-08 DIAGNOSIS — N83202 Unspecified ovarian cyst, left side: Secondary | ICD-10-CM | POA: Insufficient documentation

## 2019-11-08 DIAGNOSIS — R112 Nausea with vomiting, unspecified: Secondary | ICD-10-CM | POA: Insufficient documentation

## 2019-11-08 DIAGNOSIS — Z880 Allergy status to penicillin: Secondary | ICD-10-CM | POA: Insufficient documentation

## 2019-11-08 LAB — CBC
HCT: 40.4 % (ref 36.0–46.0)
Hemoglobin: 13.7 g/dL (ref 12.0–15.0)
MCH: 31.9 pg (ref 26.0–34.0)
MCHC: 33.9 g/dL (ref 30.0–36.0)
MCV: 94.2 fL (ref 80.0–100.0)
Platelets: 321 10*3/uL (ref 150–400)
RBC: 4.29 MIL/uL (ref 3.87–5.11)
RDW: 13.2 % (ref 11.5–15.5)
WBC: 9.8 10*3/uL (ref 4.0–10.5)
nRBC: 0 % (ref 0.0–0.2)

## 2019-11-08 LAB — COMPREHENSIVE METABOLIC PANEL
ALT: 39 U/L (ref 0–44)
AST: 33 U/L (ref 15–41)
Albumin: 4.4 g/dL (ref 3.5–5.0)
Alkaline Phosphatase: 43 U/L (ref 38–126)
Anion gap: 9 (ref 5–15)
BUN: 14 mg/dL (ref 6–20)
CO2: 23 mmol/L (ref 22–32)
Calcium: 9.2 mg/dL (ref 8.9–10.3)
Chloride: 105 mmol/L (ref 98–111)
Creatinine, Ser: 1.01 mg/dL — ABNORMAL HIGH (ref 0.44–1.00)
GFR calc Af Amer: >60 mL/min (ref >60)
GFR calc non Af Amer: >60 mL/min (ref >60)
Glucose, Bld: 124 mg/dL — ABNORMAL HIGH (ref 70–99)
Potassium: 3.9 mmol/L (ref 3.5–5.1)
Sodium: 137 mmol/L (ref 135–145)
Total Bilirubin: 0.6 mg/dL (ref 0.3–1.2)
Total Protein: 7.1 g/dL (ref 6.5–8.1)

## 2019-11-08 LAB — URINALYSIS, COMPLETE (UACMP) WITH MICROSCOPIC
Bacteria, UA: NONE SEEN
Bilirubin Urine: NEGATIVE
Glucose, UA: NEGATIVE mg/dL
Ketones, ur: NEGATIVE mg/dL
Leukocytes,Ua: NEGATIVE
Nitrite: NEGATIVE
Protein, ur: NEGATIVE mg/dL
Specific Gravity, Urine: 1.019 (ref 1.005–1.030)
pH: 5 (ref 5.0–8.0)

## 2019-11-08 LAB — LIPASE, BLOOD: Lipase: 34 U/L (ref 11–51)

## 2019-11-08 LAB — POCT PREGNANCY, URINE: Preg Test, Ur: NEGATIVE

## 2019-11-08 MED ORDER — MORPHINE SULFATE (PF) 4 MG/ML IV SOLN: 4.0000 mg | Freq: Once | INTRAVENOUS | Status: DC

## 2019-11-08 MED ORDER — SODIUM CHLORIDE 0.9% FLUSH
3.0000 mL | Freq: Once | INTRAVENOUS | Status: DC
Start: 2019-11-08 — End: 2019-11-09

## 2019-11-08 MED ORDER — IBUPROFEN 400 MG PO TABS
400.0000 mg | ORAL_TABLET | Freq: Once | ORAL | Status: AC | PRN
  Administered 2019-11-08: 400 mg via ORAL
  Filled 2019-11-08: qty 1

## 2019-11-08 MED ORDER — ONDANSETRON HCL 4 MG/2ML IJ SOLN
4.0000 mg | Freq: Once | INTRAMUSCULAR | Status: AC | PRN
  Administered 2019-11-08: 4 mg via INTRAVENOUS
  Filled 2019-11-08: qty 2

## 2019-11-08 MED ORDER — MORPHINE SULFATE (PF) 2 MG/ML IV SOLN
INTRAVENOUS | Status: AC
  Administered 2019-11-08: 4 mg via INTRAVENOUS
  Filled 2019-11-08: qty 2

## 2019-11-08 NOTE — ED Notes (Signed)
Pt updated on delay. Pt verbalizes understanding.  

## 2019-11-08 NOTE — ED Triage Notes (Signed)
Pt to ED via POV c/o sudden onset LLQ abd pain and nausea. Pt states that she was at work when the pain started. Pt states that the pain does not radiate. Pt is burping in triage but has not vomited. Pt is in NAD.

## 2019-11-09 ENCOUNTER — Emergency Department
Admission: EM | Admit: 2019-11-09 | Discharge: 2019-11-09 | Disposition: A | Discharge status: Home / Self Care | Payer: Self-pay | Attending: Emergency Medicine | Admitting: Emergency Medicine

## 2019-11-09 DIAGNOSIS — R1032 Left lower quadrant pain: Secondary | ICD-10-CM

## 2019-11-09 DIAGNOSIS — N83202 Unspecified ovarian cyst, left side: Secondary | ICD-10-CM

## 2019-11-09 MED ORDER — ONDANSETRON 4 MG PO TBDP: 4.0000 mg | ORAL_TABLET | Freq: Three times a day (TID) | ORAL | 0 refills | Status: DC | PRN

## 2019-11-09 MED ORDER — HYDROCODONE-ACETAMINOPHEN 5-325 MG PO TABS: 1.0000 | ORAL_TABLET | Freq: Four times a day (QID) | ORAL | 0 refills | Status: DC | PRN

## 2019-11-09 MED ORDER — ONDANSETRON HCL 4 MG/2ML IJ SOLN
4.0000 mg | Freq: Once | INTRAMUSCULAR | Status: AC
Start: 2019-11-09 — End: 2019-11-09
  Administered 2019-11-09: 4 mg via INTRAVENOUS
  Filled 2019-11-09: qty 2

## 2019-11-09 MED ORDER — IBUPROFEN 600 MG PO TABS: 600.0000 mg | ORAL_TABLET | Freq: Three times a day (TID) | ORAL | 0 refills | Status: DC | PRN

## 2019-11-09 MED ORDER — HYDROCODONE-ACETAMINOPHEN 5-325 MG PO TABS
2.0000 | ORAL_TABLET | Freq: Once | ORAL | Status: AC
  Administered 2019-11-09: 2 via ORAL
  Filled 2019-11-09: qty 2

## 2019-11-09 MED ORDER — KETOROLAC TROMETHAMINE 30 MG/ML IJ SOLN
30.0000 mg | Freq: Once | INTRAMUSCULAR | Status: AC
  Administered 2019-11-09: 30 mg via INTRAVENOUS
  Filled 2019-11-09: qty 1

## 2019-11-09 NOTE — ED Provider Notes (Signed)
[facility] Emergency Department Provider Note  ____________________________________________   None    (approximate)  I have reviewed the triage vital signs and the nursing notes.   HISTORY  Chief Complaint Abdominal Pain and Nausea    HPI Hannah CooperCarol J Shah is a 49 y.o. female  With PMHx COPD, spinal stenosis, here with left abd pain. Pt reports she was in her usual state of health until this afternoon, when she was at work. She had just eaten some pizza but had otherwise been feeling well. She reports that while working, she had acute onset of severe left-sided abd pain, along with nausea and NBNB emesis. Her pain has persisted since then. It is throughout the left side along with some radiation toward her left upper back. No fever, chills. No blood in emesis. Pain is constant, aching, gnawing, worse w/ movement, palpation. No alleviating factors. Denies known h/o stones. Of note, pt has had some irregular periods over the past few months, but denies known h/o stones. She is s/p tubal ligation.       Past Medical History:  Diagnosis Date  . COPD (chronic obstructive pulmonary disease) (HCC)   . DDD (degenerative disc disease), cervical   . H. pylori infection   . Spinal stenosis     Patient Active Problem List   Diagnosis Date Noted  . Flu 06/15/2016    Past Surgical History:  Procedure Laterality Date  . TUBAL LIGATION  1994  . TUBAL LIGATION      Prior to Admission medications   Medication Sig Start Date End Date Taking? Authorizing Provider  albuterol (PROVENTIL HFA;VENTOLIN HFA) 108 (90 Base) MCG/ACT inhaler Inhale 2 puffs into the lungs every 4 (four) hours as needed for wheezing or shortness of breath.    [provider]  Fluticasone-Salmeterol (ADVAIR) 250-50 MCG/DOSE AEPB Inhale 1 puff into the lungs daily.    [provider]  guaiFENesin-codeine 100-10 MG/5ML syrup Take 5 mLs by mouth every 6 (six) hours as needed for  cough. 03/24/18   Tommi RumpsSummers, Rhonda L, PA-C  HYDROcodone-acetaminophen (NORCO/VICODIN) 5-325 MG tablet Take 1-2 tablets by mouth every 6 (six) hours as needed for moderate pain or severe pain. 11/09/19 11/08/20  Shaune PollackIsaacs, Trimaine Maser, MD  ibuprofen (ADVIL) 600 MG tablet Take 1 tablet (600 mg total) by mouth every 8 (eight) hours as needed for moderate pain. 11/09/19   Shaune PollackIsaacs, Collyns Mcquigg, MD  ondansetron (ZOFRAN ODT) 4 MG disintegrating tablet Take 1 tablet (4 mg total) by mouth every 8 (eight) hours as needed for nausea or vomiting. 11/09/19   Shaune PollackIsaacs, Joshlyn Beadle, MD  tiotropium (SPIRIVA) 18 MCG inhalation capsule Place 18 mcg into inhaler and inhale daily.    [provider]    Allergies Penicillins  Family History  Problem Relation Age of Onset  . Diabetes Mother   . Heart failure Mother   . Heart attack Father     Social History Social History   Tobacco Use  . Smoking status: Current Every Day Smoker    Packs/day: 0.50    Years: 30.00    Pack years: 15.00  . Smokeless tobacco: Never Used  Substance Use Topics  . Alcohol use: No  . Drug use: No    Review of Systems  Review of Systems  Constitutional: Positive for fatigue. Negative for fever.  HENT: Negative for congestion and sore throat.   Eyes: Negative for visual disturbance.  Respiratory: Negative for cough and shortness of breath.   Cardiovascular: Negative for chest  pain.  Gastrointestinal: Positive for abdominal pain and nausea. Negative for diarrhea, rectal pain and vomiting.  Genitourinary: Positive for flank pain.  Musculoskeletal: Negative for back pain and neck pain.  Skin: Negative for rash and wound.  Neurological: Negative for weakness.  All other systems reviewed and are negative.    ____________________________________________  PHYSICAL EXAM:      VITAL SIGNS: ED Triage Vitals  Enc Vitals Group     BP 11/08/19 1537 (!) 118/59     Pulse Rate 11/08/19 1537 87     Resp 11/08/19 1824 18     Temp 11/08/19 1537  98.4 F (36.9 C)     Temp Source 11/08/19 1537 Oral     SpO2 11/08/19 1537 97 %     Weight 11/08/19 1537 285 lb (129.3 kg)     Height 11/08/19 1537 5\' 4"  (1.626 m)     Head Circumference --      Peak Flow --      Pain Score 11/08/19 1543 10     Pain Loc --      Pain Edu? --      Excl. in GC? --      Physical Exam Vitals and nursing note reviewed.  Constitutional:      General: She is not in acute distress.    Appearance: She is well-developed.  HENT:     Head: Normocephalic and atraumatic.  Eyes:     Conjunctiva/sclera: Conjunctivae normal.  Cardiovascular:     Rate and Rhythm: Normal rate and regular rhythm.     Heart sounds: Normal heart sounds. No murmur heard.  No friction rub.  Pulmonary:     Effort: Pulmonary effort is normal. No respiratory distress.     Breath sounds: Normal breath sounds. No wheezing or rales.  Abdominal:     General: There is no distension.     Palpations: Abdomen is soft.     Tenderness: There is abdominal tenderness in the left upper quadrant and left lower quadrant. There is no guarding or rebound.  Musculoskeletal:     Cervical back: Neck supple.  Skin:    General: Skin is warm.     Capillary Refill: Capillary refill takes less than 2 seconds.  Neurological:     Mental Status: She is alert and oriented to person, place, and time.     Motor: No abnormal muscle tone.       ____________________________________________   LABS (all labs ordered are listed, but only abnormal results are displayed)  Labs Reviewed  COMPREHENSIVE METABOLIC PANEL - Abnormal; Notable for the following components:      Result Value   Glucose, Bld 124 (*)    Creatinine, Ser 1.01 (*)    All other components within normal limits  URINALYSIS, COMPLETE (UACMP) WITH MICROSCOPIC - Abnormal; Notable for the following components:   Color, Urine YELLOW (*)    APPearance HAZY (*)    Hgb urine dipstick MODERATE (*)    All other components within normal limits    LIPASE, BLOOD  CBC  POC URINE PREG, ED  POCT PREGNANCY, URINE    ____________________________________________  EKG: None ________________________________________  RADIOLOGY All imaging, including plain films, CT scans, and ultrasounds, independently reviewed by me, and interpretations confirmed via formal radiology reads.  ED MD interpretation:   None  Official radiology report(s): DG Abdomen Acute W/Chest  Result Date: 11/08/2019 CLINICAL DATA:  Abdominal pain. LEFT LOWER QUADRANT pain and nausea. EXAM: DG ABDOMEN ACUTE W/ 1V CHEST COMPARISON:  06/13/2015 CT FINDINGS: Heart size is normal. Lungs are free of focal consolidations and pleural effusions. No pulmonary edema. Supine and erect views of the abdomen show a nonobstructive bowel gas pattern. No evidence for organomegaly. Multiple phleboliths are present pelvis. IMPRESSION: Negative abdominal radiographs. No acute cardiopulmonary disease. Electronically Signed   By: Norva Pavlov M.D.   On: 11/08/2019 16:57   CT Renal Stone Study  Result Date: 11/08/2019 CLINICAL DATA:  Hematuria, abdominal pain EXAM: CT ABDOMEN AND PELVIS WITHOUT CONTRAST TECHNIQUE: Multidetector CT imaging of the abdomen and pelvis was performed following the standard protocol without IV contrast. COMPARISON:  06/13/2015 FINDINGS: Lower chest: No acute abnormality. Hepatobiliary: No solid liver abnormality is seen. No gallstones, gallbladder wall thickening, or biliary dilatation. Pancreas: Unremarkable. No pancreatic ductal dilatation or surrounding inflammatory changes. Spleen: Normal in size without significant abnormality. Adrenals/Urinary Tract: Adrenal glands are unremarkable. Kidneys are normal, without renal calculi, solid lesion, or hydronephrosis. Bladder is unremarkable. Stomach/Bowel: Stomach is within normal limits. Appendix appears normal. No evidence of bowel wall thickening, distention, or inflammatory changes. Occasional sigmoid diverticula.  Vascular/Lymphatic: No significant vascular findings are present. No enlarged abdominal or pelvic lymph nodes. Reproductive: No mass or other significant abnormality. 3.1 cm fluid attenuation cyst of the left ovary. Other: No abdominal wall hernia or abnormality. No abdominopelvic ascites. Musculoskeletal: No acute or significant osseous findings. IMPRESSION: 1. No non-contrast CT findings of the abdomen or pelvis to explain hematuria. No evidence of urinary tract calculus or hydronephrosis. 2. Occasional sigmoid diverticula without evidence of acute diverticulitis. 3. There is a 3.1 cm fluid attenuation cyst of the left ovary, likely functional. Electronically Signed   By: Lauralyn Primes M.D.   On: 11/08/2019 18:23    ____________________________________________  PROCEDURES   Procedure(s) performed (including Critical Care):  Procedures  ____________________________________________  INITIAL IMPRESSION / MDM / ASSESSMENT AND PLAN / ED COURSE  As part of my medical decision making, I reviewed the following data within the electronic MEDICAL RECORD NUMBER Nursing notes reviewed and incorporated, Old chart reviewed, Notes from prior ED visits, and [place] Controlled Substance Database       *Hannah Shah was evaluated in Emergency Department on 11/09/2019 for the symptoms described in the history of present illness. She was evaluated in the context of the global COVID-19 pandemic, which necessitated consideration that the patient might be at risk for infection with the SARS-CoV-2 virus that causes COVID-19. Institutional protocols and algorithms that pertain to the evaluation of patients at risk for COVID-19 are in a state of rapid change based on information released by regulatory bodies including the CDC and federal and state organizations. These policies and algorithms were followed during the patient's care in the ED.  Some ED evaluations and interventions may be delayed as a result of limited staffing  during the pandemic.*     Medical Decision Making:  49 yo F here with severe L flank pain, improving on my assessment. Labs, imaging from triage reviewed. AAS negative. CT scan shows possible left ovarian simple cyst, otherwise unremarkable. No signs of stone, diverticulitis, or other abnormality. Labs overall reassuring - no significant leukocytosis, LFTs and renal function normal. UA with mild hematuria but otherwise unremarkable.  DDx remains possible pain from ovarian cyst (she is 2 weeks s/p recent period, which would fit clinically). No signs of torsion on CT and history is less concerning for this. Otherwise, could also be passed renal stone given hematuria. No signs of pyelo or pyuria. Feels much better  after analgesia here and tolerating PO. Will d/c with symptomatic control, outpatient follow-up.  ____________________________________________  FINAL CLINICAL IMPRESSION(S) / ED DIAGNOSES  Final diagnoses:  Left lower quadrant pain  Cyst of left ovary     MEDICATIONS GIVEN DURING THIS VISIT:  Medications  sodium chloride flush (NS) 0.9 % injection 3 mL (3 mLs Intravenous Not Given 11/08/19 1727)  morphine 4 MG/ML injection 4 mg (0 mg Intravenous Hold 11/08/19 1727)  ondansetron (ZOFRAN) injection 4 mg (4 mg Intravenous Given 11/08/19 1557)  morphine 2 MG/ML injection (4 mg Intravenous Given 11/08/19 1558)  ibuprofen (ADVIL) tablet 400 mg (400 mg Oral Given 11/08/19 2028)  ketorolac (TORADOL) 30 MG/ML injection 30 mg (30 mg Intravenous Given 11/09/19 0236)  ondansetron (ZOFRAN) injection 4 mg (4 mg Intravenous Given 11/09/19 0236)  HYDROcodone-acetaminophen (NORCO/VICODIN) 5-325 MG per tablet 2 tablet (2 tablets Oral Given 11/09/19 0236)     ED Discharge Orders         Ordered    HYDROcodone-acetaminophen (NORCO/VICODIN) 5-325 MG tablet  Every 6 hours PRN     Discontinue  Reprint     11/09/19 0327    ibuprofen (ADVIL) 600 MG tablet  Every 8 hours PRN     Discontinue  Reprint     11/09/19  0327    ondansetron (ZOFRAN ODT) 4 MG disintegrating tablet  Every 8 hours PRN     Discontinue  Reprint     11/09/19 0327           Note:  This document was prepared using Dragon voice recognition software and may include unintentional dictation errors.   Shaune Pollack, MD 11/09/19 [phone]

## 2019-11-09 NOTE — ED Notes (Signed)
Pt tolerating po.

## 2019-11-26 ENCOUNTER — Other Ambulatory Visit: Payer: Self-pay

## 2019-11-26 ENCOUNTER — Emergency Department
Admission: EM | Admit: 2019-11-26 | Discharge: 2019-11-26 | Disposition: A | Discharge status: Home / Self Care | Payer: Self-pay | Attending: Emergency Medicine | Admitting: Emergency Medicine

## 2019-11-26 DIAGNOSIS — F17210 Nicotine dependence, cigarettes, uncomplicated: Secondary | ICD-10-CM | POA: Insufficient documentation

## 2019-11-26 DIAGNOSIS — Z79899 Other long term (current) drug therapy: Secondary | ICD-10-CM | POA: Insufficient documentation

## 2019-11-26 DIAGNOSIS — R202 Paresthesia of skin: Secondary | ICD-10-CM

## 2019-11-26 DIAGNOSIS — M62838 Other muscle spasm: Secondary | ICD-10-CM

## 2019-11-26 DIAGNOSIS — J449 Chronic obstructive pulmonary disease, unspecified: Secondary | ICD-10-CM | POA: Insufficient documentation

## 2019-11-26 DIAGNOSIS — Z7951 Long term (current) use of inhaled steroids: Secondary | ICD-10-CM | POA: Insufficient documentation

## 2019-11-26 LAB — CBC
HCT: 40.4 % (ref 36.0–46.0)
Hemoglobin: 13.7 g/dL (ref 12.0–15.0)
MCH: 31.8 pg (ref 26.0–34.0)
MCHC: 33.9 g/dL (ref 30.0–36.0)
MCV: 93.7 fL (ref 80.0–100.0)
Platelets: 313 10*3/uL (ref 150–400)
RBC: 4.31 MIL/uL (ref 3.87–5.11)
RDW: 12.6 % (ref 11.5–15.5)
WBC: 7.4 10*3/uL (ref 4.0–10.5)
nRBC: 0 % (ref 0.0–0.2)

## 2019-11-26 LAB — BASIC METABOLIC PANEL
Anion gap: 12 (ref 5–15)
BUN: 21 mg/dL — ABNORMAL HIGH (ref 6–20)
CO2: 20 mmol/L — ABNORMAL LOW (ref 22–32)
Calcium: 9.3 mg/dL (ref 8.9–10.3)
Chloride: 105 mmol/L (ref 98–111)
Creatinine, Ser: 0.97 mg/dL (ref 0.44–1.00)
GFR calc Af Amer: >60 mL/min (ref >60)
GFR calc non Af Amer: >60 mL/min (ref >60)
Glucose, Bld: 110 mg/dL — ABNORMAL HIGH (ref 70–99)
Potassium: 3.8 mmol/L (ref 3.5–5.1)
Sodium: 137 mmol/L (ref 135–145)

## 2019-11-26 LAB — URINALYSIS, COMPLETE (UACMP) WITH MICROSCOPIC
Bilirubin Urine: NEGATIVE
Glucose, UA: NEGATIVE mg/dL
Ketones, ur: NEGATIVE mg/dL
Leukocytes,Ua: NEGATIVE
Nitrite: NEGATIVE
Protein, ur: NEGATIVE mg/dL
Specific Gravity, Urine: 1.019 (ref 1.005–1.030)
pH: 5 (ref 5.0–8.0)

## 2019-11-26 LAB — GLUCOSE, CAPILLARY: Glucose-Capillary: 105 mg/dL — ABNORMAL HIGH (ref 70–99)

## 2019-11-26 MED ORDER — LIDOCAINE HCL (PF) 1 % IJ SOLN
10.0000 mL | Freq: Once | INTRAMUSCULAR | Status: AC
  Administered 2019-11-26: 10 mL via INTRADERMAL
  Filled 2019-11-26: qty 10

## 2019-11-26 MED ORDER — DIAZEPAM 5 MG/ML IJ SOLN
5.0000 mg | Freq: Once | INTRAMUSCULAR | Status: AC
  Administered 2019-11-26: 5 mg via INTRAVENOUS
  Filled 2019-11-26: qty 2

## 2019-11-26 MED ORDER — KETOROLAC TROMETHAMINE 30 MG/ML IJ SOLN
15.0000 mg | Freq: Once | INTRAMUSCULAR | Status: AC
  Administered 2019-11-26: 15 mg via INTRAVENOUS
  Filled 2019-11-26: qty 1

## 2019-11-26 MED ORDER — ACETAMINOPHEN 500 MG PO TABS
1000.0000 mg | ORAL_TABLET | Freq: Once | ORAL | Status: AC
  Administered 2019-11-26: 1000 mg via ORAL
  Filled 2019-11-26: qty 2

## 2019-11-26 MED ORDER — LACTATED RINGERS IV BOLUS
1000.0000 mL | Freq: Once | INTRAVENOUS | Status: AC
  Administered 2019-11-26: 1000 mL via INTRAVENOUS

## 2019-11-26 NOTE — ED Triage Notes (Signed)
Reports onset of nausea, lower back pain, lower neck pain X "a few hours" 0830AM and bilateral tingling to hands and feet starting about 30 minutes ago.  Able to ambulate. Reports tingling sensation is a "pins and needles". No unilateral extremity weakness

## 2019-11-26 NOTE — Discharge Instructions (Signed)
Please take Tylenol and ibuprofen/Advil for your pain.  It is safe to take them together, or to alternate them every few hours.  Take up to 1000mg  of Tylenol at a time, up to 4 times per day.  Do not take more than 4000 mg of Tylenol in 24 hours.  For ibuprofen, take 400-600 mg, 4-5 times per day.  If you develop any fevers with your symptoms, strokelike symptoms or worsening symptoms despite the above medications, please return to the ED.

## 2019-11-26 NOTE — ED Notes (Signed)
Iv started  meds given.  siderails up x 2.

## 2019-11-26 NOTE — ED Provider Notes (Signed)
[facility] Emergency Department Provider Note ____________________________________________   First MD Initiated Contact with Patient 11/26/19 1437     (approximate)  I have reviewed the triage vital signs and the nursing notes.  HISTORY  Chief Complaint Head Injury, Back Pain, and Tingling   HPI Hannah Shah is a 49 y.o. femalewho presents to the ED for evaluation of left shoulder, head and back pain.  Chart review indicates history of degenerative disc disease.  Patient denies take any prescription medications regularly.  She denies any recent illnesses or injuries.  Patient reports waking up this morning at her normal time with severe left shoulder, left head/neck and left lower back pain.  She reports "I may have slept wrong."  She denies any preceding injuries or trauma.  She reports feeling well and "normal" yesterday without these symptoms.  She reports the pain has been 8/10 severity all morning and constant, aching in nature, and she has not taken any medications at home to help alleviate this pain.  She reports associated tingling sensations to her bilateral fingertips of all 10 fingers, but otherwise reports normal sensation throughout her extremities and denies pain/trauma to her extremities.  Again, denies trauma of any kind.  She reports her left lower back pain radiates down her left buttocks and hip, into her lower leg.  Patient denies fevers, trauma, syncope, recent illnesses or antibiotic use, back surgeries, urine or stool incontinence, urine or stool retention, saddle anesthesias.  She reports that she has been able to walk today, she was ambulatory back to her room in the ED.     Past Medical History:  Diagnosis Date  . COPD (chronic obstructive pulmonary disease) (HCC)   . DDD (degenerative disc disease), cervical   . H. pylori infection   . Spinal stenosis     Patient Active Problem List   Diagnosis Date Noted  . Flu 06/15/2016      Past Surgical History:  Procedure Laterality Date  . TUBAL LIGATION  1994  . TUBAL LIGATION      Prior to Admission medications   Medication Sig Start Date End Date Taking? Authorizing Provider  albuterol (PROVENTIL HFA;VENTOLIN HFA) 108 (90 Base) MCG/ACT inhaler Inhale 2 puffs into the lungs every 4 (four) hours as needed for wheezing or shortness of breath.    [provider]  Fluticasone-Salmeterol (ADVAIR) 250-50 MCG/DOSE AEPB Inhale 1 puff into the lungs daily.    [provider]  HYDROcodone-acetaminophen (NORCO/VICODIN) 5-325 MG tablet Take 1-2 tablets by mouth every 6 (six) hours as needed for moderate pain or severe pain. 11/09/19 11/08/20  Shaune Pollack, MD  ibuprofen (ADVIL) 600 MG tablet Take 1 tablet (600 mg total) by mouth every 8 (eight) hours as needed for moderate pain. 11/09/19   Shaune Pollack, MD  ondansetron (ZOFRAN ODT) 4 MG disintegrating tablet Take 1 tablet (4 mg total) by mouth every 8 (eight) hours as needed for nausea or vomiting. 11/09/19   Shaune Pollack, MD  tiotropium (SPIRIVA) 18 MCG inhalation capsule Place 18 mcg into inhaler and inhale daily.    [provider]    Allergies Penicillins  Family History  Problem Relation Age of Onset  . Diabetes Mother   . Heart failure Mother   . Heart attack Father     Social History Social History   Tobacco Use  . Smoking status: Current Every Day Smoker    Packs/day: 0.50    Years: 30.00    Pack years: 15.00  .  Smokeless tobacco: Never Used  Substance Use Topics  . Alcohol use: No  . Drug use: No    Review of Systems  Constitutional: No fever/chills Eyes: No visual changes. ENT: No sore throat. Cardiovascular: Denies chest pain. Respiratory: Denies shortness of breath. Gastrointestinal: No abdominal pain.  No nausea, no vomiting.  No diarrhea.  No constipation. Genitourinary: Negative for dysuria. Musculoskeletal: Positive for back pain, neck pain, Skin: Negative for  rash. Neurological: Negative forfocal weakness or numbness.  Positive for headache.   ____________________________________________   PHYSICAL EXAM:  VITAL SIGNS: Vitals:   11/26/19 1026  BP: 111/72  Pulse: 77  Resp: 18  Temp: 98.2 F (36.8 C)  SpO2: 96%      Constitutional: Alert and oriented.  Sitting up in bed, obese and tearful.  Speaking in full sentences without distress. Eyes: Conjunctivae are normal. PERRL. EOMI. Head: Atraumatic. Nose: No congestion/rhinnorhea. Mouth/Throat: Mucous membranes are moist.  Oropharynx non-erythematous. Neck: No stridor. No cervical spine tenderness to palpation. Cardiovascular: Normal rate, regular rhythm. Grossly normal heart sounds.  Good peripheral circulation. Respiratory: Normal respiratory effort.  No retractions. Lungs CTAB. Gastrointestinal: Soft , nondistended, nontender to palpation. No abdominal bruits. No CVA tenderness. Musculoskeletal: No lower extremity tenderness nor edema.  No joint effusions. No signs of acute trauma. Most tender over the left trapezius, without overlying skin changes or signs of trauma.  Muscular knot is noted within this muscle and she is remarkably tender there.  She reports radiating down her left arm and an exacerbation of her fingertip paresthesias in her left hand. Left-sided paraspinal cervical tenderness to palpation, particularly at her left-sided occiput, that radiates across the top of her head and reproduces her headache symptoms. Left-sided paraspinal lumbar tenderness to palpation that radiates down her leg and reproduces her leg and back symptoms. Neurologic:  Normal speech and language. No gross focal neurologic deficits are appreciated. No gait instability noted. .Cranial nerves II through XII intact 5/5 strength and sensation in all 4 extremities Skin:  Skin is warm, dry and intact. No rash noted. Psychiatric: Mood and affect are normal. Speech and behavior are  normal.  ____________________________________________   LABS (all labs ordered are listed, but only abnormal results are displayed)  Labs Reviewed  BASIC METABOLIC PANEL - Abnormal; Notable for the following components:      Result Value   CO2 20 (*)    Glucose, Bld 110 (*)    BUN 21 (*)    All other components within normal limits  URINALYSIS, COMPLETE (UACMP) WITH MICROSCOPIC - Abnormal; Notable for the following components:   Color, Urine YELLOW (*)    APPearance CLEAR (*)    Hgb urine dipstick LARGE (*)    Bacteria, UA RARE (*)    All other components within normal limits  GLUCOSE, CAPILLARY - Abnormal; Notable for the following components:   Glucose-Capillary 105 (*)    All other components within normal limits  CBC  CBG MONITORING, ED  POC URINE PREG, ED   ____________________________________________  12 Lead EKG  Sinus rhythm, rate of 73 bpm.  Normal axis and intervals.  No evidence of acute ischemia. ____________________________________________   PROCEDURES and INTERVENTIONS  Procedure(s) performed (including Critical Care):  Procedures   Trigger point injection: Point of maximal tenderness identified along the left trapezius muscle.  No overlying skin changes or evidence of cellulitis. Skin prepared with alcohol swabs 22-gauge 1-1/2 inch needle on a 10 cc syringe, filled with 1% lidocaine without epinephrine, advanced about three quarters  of an inch beneath the skin to the point of maximal tenderness.  Negative aspiration for air, blood.  Injection of 9 cc of lidocaine.  Covered with adhesive bandage Well-tolerated without immediate apparent complications  Medications  lidocaine (PF) (XYLOCAINE) 1 % injection 10 mL (has no administration in time range)  acetaminophen (TYLENOL) tablet 1,000 mg (1,000 mg Oral Given 11/26/19 1524)  ketorolac (TORADOL) 30 MG/ML injection 15 mg (15 mg Intravenous Given 11/26/19 1525)  diazepam (VALIUM) injection 5 mg (5 mg  Intravenous Given 11/26/19 1524)  lactated ringers bolus 1,000 mL (1,000 mLs Intravenous New Bag/Given 11/26/19 1525)    ____________________________________________   MDM / ED COURSE  49 year old female presenting with atraumatic pain to her left trapezius, left neck and left lumbar back most consistent with muscular spasm, and amenable to outpatient management.  Normal vital signs on room air.  Exam initially with a very uncomfortable-appearing patient who is tearful due to the pain of her back and neck.  She has exquisite tenderness and palpable muscular spasm to her left trapezius muscle that reproduces her symptoms, similarly is quite tender to her left occiput that radiates across the top of her head and reproduces her headache symptoms.  Less severe tenderness to her left lumbar back, producing sciatica symptoms.  Patient adamantly denies any trauma or falls to precipitate the symptoms, and she is neurovascularly intact without signs of trauma, and therefore deferred imaging at this time.  Initially provided Tylenol, fluids Toradol and Valium with improving symptoms, but persistent trapezius pain.  Therefore attempted trigger point injection over her left trapezius muscular spasm, as above, which was well-tolerated and resolved her symptoms.  Patient subsequently ambulatory throughout the ED independently to the restroom and back.  She reports resolution of her pain and has no complaints.  We discussed outpatient management of her symptoms, we discussed return precautions for the ED.  Patient medically stable for discharge home.  Clinical Course as of Nov 26 1810  Wed Nov 26, 2019  1542 Reassessed.  Patient reports improving symptoms.  Sleepy from the Valium.  Reexamination yields continued tenderness of her left trapezius, though improved from previous.  We discussed trigger point injection.  She is amenable.   [DS]  1641 Trigger point injection, as above.  Well-tolerated.  Patient reports  improving symptoms prior to this injection after medication administration.   [DS]  1745 Reassessed.  Patient reports resolution of symptoms.  She is thankful.   [DS]  1759 Patient ambulates to the restroom by herself to urinate.   [DS]    Clinical Course User Index [DS] Delton Prairie, MD     ____________________________________________   FINAL CLINICAL IMPRESSION(S) / ED DIAGNOSES  Final diagnoses:  Muscle spasm  Trapezius muscle spasm  Paresthesias     ED Discharge Orders    None       Doug Bucklin Katrinka Blazing   Note:  This document was prepared using Dragon voice recognition software and may include unintentional dictation errors.   Delton Prairie, MD 11/26/19 [phone]

## 2019-12-09 ENCOUNTER — Emergency Department: Payer: Self-pay

## 2019-12-09 ENCOUNTER — Encounter: Payer: Self-pay | Admitting: Emergency Medicine

## 2019-12-09 ENCOUNTER — Other Ambulatory Visit: Payer: Self-pay

## 2019-12-09 ENCOUNTER — Emergency Department
Admission: EM | Admit: 2019-12-09 | Discharge: 2019-12-09 | Disposition: A | Discharge status: Home / Self Care | Payer: Self-pay | Attending: Emergency Medicine | Admitting: Emergency Medicine

## 2019-12-09 DIAGNOSIS — Z79899 Other long term (current) drug therapy: Secondary | ICD-10-CM | POA: Insufficient documentation

## 2019-12-09 DIAGNOSIS — Z20822 Contact with and (suspected) exposure to covid-19: Secondary | ICD-10-CM | POA: Insufficient documentation

## 2019-12-09 DIAGNOSIS — J449 Chronic obstructive pulmonary disease, unspecified: Secondary | ICD-10-CM | POA: Insufficient documentation

## 2019-12-09 DIAGNOSIS — F17200 Nicotine dependence, unspecified, uncomplicated: Secondary | ICD-10-CM | POA: Insufficient documentation

## 2019-12-09 DIAGNOSIS — J209 Acute bronchitis, unspecified: Secondary | ICD-10-CM | POA: Insufficient documentation

## 2019-12-09 DIAGNOSIS — Z7951 Long term (current) use of inhaled steroids: Secondary | ICD-10-CM | POA: Insufficient documentation

## 2019-12-09 DIAGNOSIS — Z7952 Long term (current) use of systemic steroids: Secondary | ICD-10-CM | POA: Insufficient documentation

## 2019-12-09 LAB — SARS CORONAVIRUS 2 BY RT PCR (HOSPITAL ORDER, PERFORMED IN ~~LOC~~ HOSPITAL LAB): SARS Coronavirus 2: NEGATIVE

## 2019-12-09 MED ORDER — PREDNISONE 20 MG PO TABS: ORAL_TABLET | ORAL | 0 refills | Status: DC

## 2019-12-09 MED ORDER — AZITHROMYCIN 250 MG PO TABS: ORAL_TABLET | ORAL | 0 refills | Status: DC

## 2019-12-09 NOTE — ED Provider Notes (Signed)
[facility] Emergency Department Provider Note   ____________________________________________   First MD Initiated Contact with Patient 12/09/19 1141     (approximate)  I have reviewed the triage vital signs and the nursing notes.   HISTORY  Chief Complaint Facial Pain, Nasal Congestion, Cough, and Headache   HPI Hannah Shah is a 49 y.o. female presents to the ED with complaint of nasal congestion, headache and sinus pressure.  Patient is also had a dry cough.  She is uncertain of fever.  No known Covid exposure.  Patient is a smoker.  Patient is not vaccinated.  She rates her pain as 6 out of 10.         Past Medical History:  Diagnosis Date  . COPD (chronic obstructive pulmonary disease) (HCC)   . DDD (degenerative disc disease), cervical   . H. pylori infection   . Spinal stenosis     Patient Active Problem List   Diagnosis Date Noted  . Flu 06/15/2016    Past Surgical History:  Procedure Laterality Date  . TUBAL LIGATION  1994  . TUBAL LIGATION      Prior to Admission medications   Medication Sig Start Date End Date Taking? Authorizing Provider  albuterol (PROVENTIL HFA;VENTOLIN HFA) 108 (90 Base) MCG/ACT inhaler Inhale 2 puffs into the lungs every 4 (four) hours as needed for wheezing or shortness of breath.    [provider]  azithromycin (ZITHROMAX Z-PAK) 250 MG tablet Take 2 tablets (500 mg) on  Day 1,  followed by 1 tablet (250 mg) once daily on Days 2 through 5. 12/09/19   Tommi Rumps, PA-C  Fluticasone-Salmeterol (ADVAIR) 250-50 MCG/DOSE AEPB Inhale 1 puff into the lungs daily.    [provider]  predniSONE (DELTASONE) 20 MG tablet Take 2 tablets once a day for 5 days 12/09/19   Tommi Rumps, PA-C  tiotropium (SPIRIVA) 18 MCG inhalation capsule Place 18 mcg into inhaler and inhale daily.    [provider]    Allergies Penicillins  Family History  Problem Relation Age of Onset  .  Diabetes Mother   . Heart failure Mother   . Heart attack Father     Social History Social History   Tobacco Use  . Smoking status: Current Every Day Smoker    Packs/day: 0.50    Years: 30.00    Pack years: 15.00  . Smokeless tobacco: Never Used  Substance Use Topics  . Alcohol use: No  . Drug use: No    Review of Systems Constitutional: No fever/chills Eyes: No visual changes. ENT: No sore throat.  Positive nasal congestion and sinus pressure. Cardiovascular: Denies chest pain. Respiratory: Denies shortness of breath.  Positive for cough. Gastrointestinal: No abdominal pain.  No nausea, no vomiting.  No diarrhea.  No constipation. Genitourinary: Negative for dysuria. Musculoskeletal: Positive for muscle aches. Skin: Negative for rash. Neurological: Negative for headaches, focal weakness or numbness.  ____________________________________________   PHYSICAL EXAM:  VITAL SIGNS: ED Triage Vitals  Enc Vitals Group     BP 12/09/19 1049 138/80     Pulse Rate 12/09/19 1049 76     Resp 12/09/19 1049 20     Temp 12/09/19 1051 97.9 F (36.6 C)     Temp Source 12/09/19 1051 Oral     SpO2 12/09/19 1049 96 %     Weight 12/09/19 1049 189 lb (85.7 kg)     Height 12/09/19 1049 5\' 4"  (1.626 m)  Head Circumference --      Peak Flow --      Pain Score 12/09/19 1049 6     Pain Loc --      Pain Edu? --      Excl. in GC? --     Constitutional: Alert and oriented. Well appearing and in no acute distress. Eyes: Conjunctivae are normal. PERRL. EOMI. Head: Atraumatic. Nose: No congestion/rhinnorhea. Neck: No stridor.   Cardiovascular: Normal rate, regular rhythm. Grossly normal heart sounds.  Good peripheral circulation. Respiratory: Normal respiratory effort.  No retractions. Lungs CTAB. Gastrointestinal: Soft and nontender. No distention. Musculoskeletal: Moves upper lower extremes without any difficulty.  Normal gait was noted. Neurologic:  Normal speech and language. No  gross focal neurologic deficits are appreciated. No gait instability. Skin:  Skin is warm, dry and intact. No rash noted. Psychiatric: Mood and affect are normal. Speech and behavior are normal.  ____________________________________________   LABS (all labs ordered are listed, but only abnormal results are displayed)  Labs Reviewed  SARS CORONAVIRUS 2 BY RT PCR (HOSPITAL ORDER, PERFORMED IN [facility])    RADIOLOGY   Official radiology report(s): DG Chest Port 1 View  Result Date: 12/09/2019 CLINICAL DATA:  Cough, fever EXAM: PORTABLE CHEST 1 VIEW COMPARISON:  07/08/2017 FINDINGS: The heart size and mediastinal contours are within normal limits. There is mild, diffuse interstitial opacity, similar in appearance to prior examination. There is no new or focal airspace opacity. The visualized skeletal structures are unremarkable. IMPRESSION: Mild, diffuse interstitial opacity, similar in appearance to prior examination and may reflect mild edema and/or chronic interstitial change. No new or focal airspace opacity. Electronically Signed   By: Lauralyn Primes M.D.   On: 12/09/2019 13:15    ____________________________________________   PROCEDURES  Procedure(s) performed (including Critical Care):  Procedures   ____________________________________________   INITIAL IMPRESSION / ASSESSMENT AND PLAN / ED COURSE  As part of my medical decision making, I reviewed the following data within the electronic MEDICAL RECORD NUMBER Notes from prior ED visits and [place] Controlled Substance Database  Hannah Shah was evaluated in Emergency Department on 12/09/2019 for the symptoms described in the history of present illness. She was evaluated in the context of the global COVID-19 pandemic, which necessitated consideration that the patient might be at risk for infection with the SARS-CoV-2 virus that causes COVID-19. Institutional protocols and algorithms that pertain to the evaluation of  patients at risk for COVID-19 are in a state of rapid change based on information released by regulatory bodies including the CDC and federal and state organizations. These policies and algorithms were followed during the patient's care in the ED.  49 year old female presents to the ED with complaint of sinus pressure, nasal congestion, cough since yesterday.  Patient is unvaccinated but denies any known exposure to Covid.  Physical exam was unremarkable with the exception of a cough.  Patient Covid test was negative and chest x-ray showed no acute changes.  Patient was made aware that more likely this is acute bronchitis with her symptoms.  She was placed on a small dose of prednisone along with a Zithromax pack.  Patient is to follow-up with her PCP if any continued problems.  ____________________________________________   FINAL CLINICAL IMPRESSION(S) / ED DIAGNOSES  Final diagnoses:  Acute bronchitis, unspecified organism     ED Discharge Orders         Ordered    predniSONE (DELTASONE) 20 MG tablet  12/09/19 1336    azithromycin (ZITHROMAX Z-PAK) 250 MG tablet        12/09/19 1336           Note:  This document was prepared using Dragon voice recognition software and may include unintentional dictation errors.    Tommi Rumps, PA-C 12/09/19 1736    Sharyn Creamer, MD 12/09/19 2136

## 2019-12-09 NOTE — ED Notes (Signed)
See triage note  Presents with nasal congestion and headache  Describes h/a as sinus pressure

## 2019-12-09 NOTE — ED Triage Notes (Signed)
Pt reports since yesterday has has some nasal congestion, sinus pressure and pain, and a cough. Pt reports usually gets sinus infections but states this one is different. Pt unsure of fevers at home.

## 2019-12-09 NOTE — Discharge Instructions (Signed)
Follow-up with your primary care provider or [facility] acute care if any continued problems.  Begin taking medication as directed.  You may also take Tylenol if needed for fever, headache or body aches.  Increase fluids to stay hydrated.  Decrease smoking.  Today your Covid test is negative.

## 2020-01-21 ENCOUNTER — Encounter: Payer: Self-pay | Admitting: Emergency Medicine

## 2020-01-21 ENCOUNTER — Emergency Department
Admission: EM | Admit: 2020-01-21 | Discharge: 2020-01-21 | Disposition: A | Discharge status: Home / Self Care | Payer: HRSA Program | Attending: Emergency Medicine | Admitting: Emergency Medicine

## 2020-01-21 ENCOUNTER — Other Ambulatory Visit: Payer: Self-pay

## 2020-01-21 DIAGNOSIS — U071 COVID-19: Secondary | ICD-10-CM | POA: Diagnosis not present

## 2020-01-21 DIAGNOSIS — E860 Dehydration: Secondary | ICD-10-CM | POA: Insufficient documentation

## 2020-01-21 DIAGNOSIS — R197 Diarrhea, unspecified: Secondary | ICD-10-CM

## 2020-01-21 DIAGNOSIS — Z7951 Long term (current) use of inhaled steroids: Secondary | ICD-10-CM | POA: Diagnosis not present

## 2020-01-21 DIAGNOSIS — B349 Viral infection, unspecified: Secondary | ICD-10-CM | POA: Diagnosis not present

## 2020-01-21 DIAGNOSIS — J449 Chronic obstructive pulmonary disease, unspecified: Secondary | ICD-10-CM | POA: Insufficient documentation

## 2020-01-21 DIAGNOSIS — R5383 Other fatigue: Secondary | ICD-10-CM | POA: Diagnosis present

## 2020-01-21 LAB — URINALYSIS, COMPLETE (UACMP) WITH MICROSCOPIC
Bilirubin Urine: NEGATIVE
Glucose, UA: NEGATIVE mg/dL
Ketones, ur: NEGATIVE mg/dL
Nitrite: NEGATIVE
Protein, ur: NEGATIVE mg/dL
Specific Gravity, Urine: 1.031 — ABNORMAL HIGH (ref 1.005–1.030)
pH: 6 (ref 5.0–8.0)

## 2020-01-21 LAB — COMPREHENSIVE METABOLIC PANEL
ALT: 45 U/L — ABNORMAL HIGH (ref 0–44)
AST: 32 U/L (ref 15–41)
Albumin: 4.2 g/dL (ref 3.5–5.0)
Alkaline Phosphatase: 48 U/L (ref 38–126)
Anion gap: 9 (ref 5–15)
BUN: 18 mg/dL (ref 6–20)
CO2: 26 mmol/L (ref 22–32)
Calcium: 9.5 mg/dL (ref 8.9–10.3)
Chloride: 104 mmol/L (ref 98–111)
Creatinine, Ser: 0.93 mg/dL (ref 0.44–1.00)
GFR, Estimated: >60 mL/min (ref >60)
Glucose, Bld: 98 mg/dL (ref 70–99)
Potassium: 3.7 mmol/L (ref 3.5–5.1)
Sodium: 139 mmol/L (ref 135–145)
Total Bilirubin: 0.7 mg/dL (ref 0.3–1.2)
Total Protein: 7.1 g/dL (ref 6.5–8.1)

## 2020-01-21 LAB — CBC
HCT: 42 % (ref 36.0–46.0)
Hemoglobin: 14.3 g/dL (ref 12.0–15.0)
MCH: 31.5 pg (ref 26.0–34.0)
MCHC: 34 g/dL (ref 30.0–36.0)
MCV: 92.5 fL (ref 80.0–100.0)
Platelets: 336 10*3/uL (ref 150–400)
RBC: 4.54 MIL/uL (ref 3.87–5.11)
RDW: 12.5 % (ref 11.5–15.5)
WBC: 9.2 10*3/uL (ref 4.0–10.5)
nRBC: 0 % (ref 0.0–0.2)

## 2020-01-21 LAB — RESPIRATORY PANEL BY RT PCR (FLU A&B, COVID)
Influenza A by PCR: NEGATIVE
Influenza B by PCR: NEGATIVE
SARS Coronavirus 2 by RT PCR: POSITIVE — AB

## 2020-01-21 LAB — LIPASE, BLOOD: Lipase: 32 U/L (ref 11–51)

## 2020-01-21 MED ORDER — METRONIDAZOLE 500 MG PO TABS: 500.0000 mg | ORAL_TABLET | Freq: Two times a day (BID) | ORAL | 0 refills | Status: DC

## 2020-01-21 MED ORDER — ONDANSETRON HCL 4 MG/2ML IJ SOLN
4.0000 mg | Freq: Once | INTRAMUSCULAR | Status: AC
  Administered 2020-01-21: 4 mg via INTRAVENOUS
  Filled 2020-01-21: qty 2

## 2020-01-21 MED ORDER — SODIUM CHLORIDE 0.9 % IV SOLN
1000.0000 mL | Freq: Once | INTRAVENOUS | Status: AC
  Administered 2020-01-21: 1000 mL via INTRAVENOUS

## 2020-01-21 MED ORDER — ONDANSETRON 4 MG PO TBDP
4.0000 mg | ORAL_TABLET | Freq: Once | ORAL | Status: AC | PRN
  Administered 2020-01-21: 4 mg via ORAL
  Filled 2020-01-21: qty 1

## 2020-01-21 MED ORDER — LOPERAMIDE HCL 2 MG PO TABS: 2.0000 mg | ORAL_TABLET | Freq: Four times a day (QID) | ORAL | 0 refills | Status: DC | PRN

## 2020-01-21 MED ORDER — KETOROLAC TROMETHAMINE 30 MG/ML IJ SOLN
30.0000 mg | Freq: Once | INTRAMUSCULAR | Status: AC
  Administered 2020-01-21: 30 mg via INTRAVENOUS
  Filled 2020-01-21: qty 1

## 2020-01-21 NOTE — ED Notes (Signed)
Dr. Cyril Loosen aware of Covid positive status.

## 2020-01-21 NOTE — ED Triage Notes (Addendum)
Pt presents to ED via POV with c/o HA, nasal congestion x 3 weeks, pt c/o diarrhea x 4 weeks. Pt presents with obvious nasal congestion at this time, noted to be tearful in triage. Pt out of triage to go to the bathroom.   Pt can be heard vomiting in the bathroom at this time.

## 2020-01-21 NOTE — ED Provider Notes (Signed)
[facility] Emergency Department Provider Note   ____________________________________________    I have reviewed the triage vital signs and the nursing notes.   HISTORY  Chief Complaint Headache and Diarrhea     HPI Hannah Shah is a 49 y.o. female with history of COPD who presents with complaints of diarrhea, fatigue,, congestion.  She reports this is been ongoing for several weeks.  She has not been vaccinated gets COVID-19.  She does report her family members/grandchildren have been sick, one has had the flu reportedly.  She denies fevers or chills.  Mild cough but states that is typical for COPD.  Has not taken anything for this.  Is tolerating p.o.'s.  No abdominal pain  Past Medical History:  Diagnosis Date  . COPD (chronic obstructive pulmonary disease) (HCC)   . DDD (degenerative disc disease), cervical   . H. pylori infection   . Spinal stenosis     Patient Active Problem List   Diagnosis Date Noted  . Flu 06/15/2016    Past Surgical History:  Procedure Laterality Date  . TUBAL LIGATION  1994  . TUBAL LIGATION      Prior to Admission medications   Medication Sig Start Date End Date Taking? Authorizing Provider  albuterol (PROVENTIL HFA;VENTOLIN HFA) 108 (90 Base) MCG/ACT inhaler Inhale 2 puffs into the lungs every 4 (four) hours as needed for wheezing or shortness of breath.    [provider]  azithromycin (ZITHROMAX Z-PAK) 250 MG tablet Take 2 tablets (500 mg) on  Day 1,  followed by 1 tablet (250 mg) once daily on Days 2 through 5. 12/09/19   Tommi Rumps, PA-C  Fluticasone-Salmeterol (ADVAIR) 250-50 MCG/DOSE AEPB Inhale 1 puff into the lungs daily.    [provider]  predniSONE (DELTASONE) 20 MG tablet Take 2 tablets once a day for 5 days 12/09/19   Tommi Rumps, PA-C  tiotropium (SPIRIVA) 18 MCG inhalation capsule Place 18 mcg into inhaler and inhale daily.    [provider]      Allergies Penicillins  Family History  Problem Relation Age of Onset  . Diabetes Mother   . Heart failure Mother   . Heart attack Father     Social History Social History   Tobacco Use  . Smoking status: Current Every Day Smoker    Packs/day: 0.50    Years: 30.00    Pack years: 15.00  . Smokeless tobacco: Never Used  Substance Use Topics  . Alcohol use: No  . Drug use: No    Review of Systems  Constitutional: No fever/chills, fatigue Eyes: No visual changes.  ENT: Nasal congestion, no sore throat Cardiovascular: Denies chest pain. Respiratory: Denies shortness of breath. Gastrointestinal: As above Genitourinary: Negative for dysuria. Musculoskeletal: Negative for back pain. Skin: Negative for rash. Neurological: Negative for headaches    ____________________________________________   PHYSICAL EXAM:  VITAL SIGNS: ED Triage Vitals  Enc Vitals Group     BP 01/21/20 1010 (!) 144/79     Pulse Rate 01/21/20 1010 65     Resp 01/21/20 1010 (!) 24     Temp 01/21/20 1010 98.4 F (36.9 C)     Temp Source 01/21/20 1010 Oral     SpO2 01/21/20 1010 98 %     Weight 01/21/20 1013 85.7 kg (189 lb)     Height 01/21/20 1013 1.626 m (5\' 4" )     Head Circumference --      Peak Flow --  Pain Score 01/21/20 1013 9     Pain Loc --      Pain Edu? --      Excl. in GC? --     Constitutional: Alert and oriented. No acute distress.  Nose: No congestion/rhinnorhea. Mouth/Throat: Mucous membranes are moist.    Cardiovascular: Normal rate, regular rhythm.  Good peripheral circulation. Respiratory: Normal respiratory effort.  No retractions.  Gastrointestinal: Soft and nontender. No distention.    Musculoskeletal: No lower extremity tenderness nor edema.  Warm and well perfused Neurologic:  Normal speech and language. No gross focal neurologic deficits are appreciated.  Skin:  Skin is warm, dry and intact. No rash noted. Psychiatric: Mood and affect are normal.  Speech and behavior are normal.  ____________________________________________   LABS (all labs ordered are listed, but only abnormal results are displayed)  Labs Reviewed  COMPREHENSIVE METABOLIC PANEL - Abnormal; Notable for the following components:      Result Value   ALT 45 (*)    All other components within normal limits  URINALYSIS, COMPLETE (UACMP) WITH MICROSCOPIC - Abnormal; Notable for the following components:   Color, Urine YELLOW (*)    APPearance CLOUDY (*)    Specific Gravity, Urine 1.031 (*)    Hgb urine dipstick SMALL (*)    Leukocytes,Ua MODERATE (*)    Bacteria, UA RARE (*)    All other components within normal limits  RESPIRATORY PANEL BY RT PCR (FLU A&B, COVID)  LIPASE, BLOOD  CBC  POC URINE PREG, ED   ____________________________________________  EKG  None ____________________________________________  RADIOLOGY   ____________________________________________   PROCEDURES  Procedure(s) performed: No  Procedures   Critical Care performed: No ____________________________________________   INITIAL IMPRESSION / ASSESSMENT AND PLAN / ED COURSE  Pertinent labs & imaging results that were available during my care of the patient were reviewed by me and considered in my medical decision making (see chart for details).  Patient presents with complaints of diarrhea, congestion, fatigue for several weeks.  Overall well-appearing with reassuring exam.  Lab work demonstrates reassuring electrolytes and BUN to creatinine ratio.  Normal white blood cell count.  Differential includes viral illness, including COVID-19 or post viral syndrome.  Less likely C. difficile given normal white blood cell count and presence of congestion  We will treat with IV fluids, IV Toradol, IV Zofran, send Covid swab and reevaluate  ----------------------------------------- 8:21 PM on 01/21/2020 -----------------------------------------  Patient feeling significantly  better, will finish IV fluids, appropriate for discharge with outpatient follow-up.    ____________________________________________   FINAL CLINICAL IMPRESSION(S) / ED DIAGNOSES  Final diagnoses:  Diarrhea, unspecified type  Dehydration  Viral illness        Note:  This document was prepared using Dragon voice recognition software and may include unintentional dictation errors.   Jene Every, MD 01/21/20 2024

## 2020-01-22 ENCOUNTER — Telehealth (HOSPITAL_COMMUNITY): Payer: Self-pay | Admitting: Family

## 2020-01-22 ENCOUNTER — Telehealth (HOSPITAL_COMMUNITY): Payer: Self-pay

## 2020-01-22 DIAGNOSIS — U071 COVID-19: Secondary | ICD-10-CM

## 2020-01-22 NOTE — Telephone Encounter (Signed)
Called again regarding monoclonal antibody treatment for Covid-19 diagnosis. She qualifies from chart review, tobacco abuse and BMI.  Left voicemail with hotline number to phone back.

## 2020-01-22 NOTE — Telephone Encounter (Signed)
Called to discuss with Hannah Shah about Covid symptoms and potential candidacy for the use of casirivimab/imdevimab, a combination monoclonal antibody infusion for those with mild to moderate Covid symptoms and at a high risk of hospitalization.     Pt is qualified for this infusion at the infusion center due to co-morbid conditions and/or a member of an at-risk group, however unable to speak to patient.  Woman who answered states she is patient's daughter, and that her mother uses her phone number. She states she will give patient the message to call the number provided (which was hotline number).   Hannah Nordhoff,NP

## 2020-01-22 NOTE — Telephone Encounter (Signed)
Called to Discuss with patient about Covid symptoms and the use of the monoclonal antibody infusion for those with mild to moderate Covid symptoms and at a high risk of hospitalization.   °  °Pt appears to qualify for this infusion due to co-morbid conditions and/or a member of an at-risk group in accordance with the FDA Emergency Use Authorization.  °  °Unable to reach pt, left HIPPA compliant VM. ° °

## 2020-05-10 ENCOUNTER — Emergency Department
Admission: EM | Admit: 2020-05-10 | Discharge: 2020-05-10 | Disposition: A | Discharge status: Home / Self Care | Payer: Self-pay | Attending: Emergency Medicine | Admitting: Emergency Medicine

## 2020-05-10 ENCOUNTER — Encounter: Payer: Self-pay | Admitting: *Deleted

## 2020-05-10 ENCOUNTER — Other Ambulatory Visit: Payer: Self-pay

## 2020-05-10 ENCOUNTER — Emergency Department: Payer: Self-pay

## 2020-05-10 DIAGNOSIS — F17200 Nicotine dependence, unspecified, uncomplicated: Secondary | ICD-10-CM | POA: Insufficient documentation

## 2020-05-10 DIAGNOSIS — M545 Low back pain, unspecified: Secondary | ICD-10-CM | POA: Insufficient documentation

## 2020-05-10 DIAGNOSIS — J449 Chronic obstructive pulmonary disease, unspecified: Secondary | ICD-10-CM | POA: Insufficient documentation

## 2020-05-10 DIAGNOSIS — R0981 Nasal congestion: Secondary | ICD-10-CM | POA: Insufficient documentation

## 2020-05-10 DIAGNOSIS — Z20822 Contact with and (suspected) exposure to covid-19: Secondary | ICD-10-CM | POA: Insufficient documentation

## 2020-05-10 DIAGNOSIS — R509 Fever, unspecified: Secondary | ICD-10-CM | POA: Insufficient documentation

## 2020-05-10 MED ORDER — TRAMADOL HCL 50 MG PO TABS: 50.0000 mg | ORAL_TABLET | Freq: Four times a day (QID) | ORAL | 0 refills | Status: DC | PRN

## 2020-05-10 MED ORDER — HYDROCODONE-ACETAMINOPHEN 5-325 MG PO TABS
1.0000 | ORAL_TABLET | Freq: Once | ORAL | Status: AC
  Administered 2020-05-10: 1 via ORAL
  Filled 2020-05-10: qty 1

## 2020-05-10 MED ORDER — DOXYCYCLINE HYCLATE 100 MG PO CAPS: 100.0000 mg | ORAL_CAPSULE | Freq: Two times a day (BID) | ORAL | 0 refills | Status: DC

## 2020-05-10 MED ORDER — BACLOFEN 10 MG PO TABS: 10.0000 mg | ORAL_TABLET | Freq: Three times a day (TID) | ORAL | 0 refills | Status: AC

## 2020-05-10 MED ORDER — METHYLPREDNISOLONE 4 MG PO TBPK: ORAL_TABLET | ORAL | 0 refills | Status: DC

## 2020-05-10 NOTE — ED Triage Notes (Addendum)
Pt to ED with back pain after slipping on ice this morning and twisting to prevent a fall. Pt reporting pain is generalized in her lower back. Pain not responding to OTC medications or heat.   Pt also reporting congestion and nasal drainage x 3 days with a fever three days ago. Hx of sinus infections per pt.

## 2020-05-10 NOTE — ED Notes (Signed)
Pt ambulatory to ED room 43 with slow, steady gait. Pt denies loss of bowel/bladder. Hx of degenerative disk disease. Sensation intact down lower extremities.

## 2020-05-10 NOTE — ED Notes (Signed)
Pt to xray at this time.

## 2020-05-10 NOTE — Discharge Instructions (Addendum)
Take medications as prescribed. Follow-up with your regular doctor if not improving in 3 to 5 days. Return emergency department for worsening If your Covid test is positive, please quarantine for 7 days since you are not vaccinated.  Take over-the-counter vitamin C, vitamin D, and zinc to help boost your immune system Take over-the-counter Mucinex for cough as needed Take over-the-counter Tylenol and ibuprofen for fever and body aches Follow-up with your regular doctor as needed Return emergency department worsening Quarantine for 5 days if you are vaccinated and 7 days if you are not.  Anne Shutter is from onset of symptoms.  At that time you may return to school/work if your symptoms have improved and you have been fever free for 24 hours.

## 2020-05-10 NOTE — ED Provider Notes (Signed)
[facility] Emergency Department Provider Note  ____________________________________________   Event Date/Time   First MD Initiated Contact with Patient 05/10/20 2134     (approximate)  I have reviewed the triage vital signs and the nursing notes.   HISTORY  Chief Complaint Back Pain    HPI Hannah Shah is a 50 y.o. female presents emergency department with multiple complaints.  Patient states that she slipped and fell earlier today but did not completely land on the floor.  States she caught her self and twisted her back.  History of back problems with bulging disks.  Patient is also complaining of congestion and nasal drainage for 3 days with fever 3 days ago.  Patient is not vaccinated for Covid.  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  Also her grandson had the flu last week.  She denies any chest pain or shortness of breath.  No vomiting or diarrhea.  Patient has history of COPD    Past Medical History:  Diagnosis Date  . COPD (chronic obstructive pulmonary disease) (HCC)   . DDD (degenerative disc disease), cervical   . H. pylori infection   . Spinal stenosis     Patient Active Problem List   Diagnosis Date Noted  . Flu 06/15/2016    Past Surgical History:  Procedure Laterality Date  . TUBAL LIGATION  1994  . TUBAL LIGATION      Prior to Admission medications   Medication Sig Start Date End Date Taking? Authorizing Provider  baclofen (LIORESAL) 10 MG tablet Take 1 tablet (10 mg total) by mouth 3 (three) times daily for 10 days. 05/10/20 05/20/20 Yes Arshdeep Bolger, Roselyn Bering, PA-C  doxycycline (VIBRAMYCIN) 100 MG capsule Take 1 capsule (100 mg total) by mouth 2 (two) times daily. 05/10/20  Yes Fredrika Canby, Roselyn Bering, PA-C  methylPREDNISolone (MEDROL DOSEPAK) 4 MG TBPK tablet Take 6 pills on day one then decrease by 1 pill each day 05/10/20  Yes Essam Lowdermilk, Roselyn Bering, PA-C  traMADol (ULTRAM) 50 MG tablet Take 1 tablet (50 mg total) by mouth every 6  (six) hours as needed. 05/10/20  Yes Antwaine Boomhower, Roselyn Bering, PA-C  albuterol (PROVENTIL HFA;VENTOLIN HFA) 108 (90 Base) MCG/ACT inhaler Inhale 2 puffs into the lungs every 4 (four) hours as needed for wheezing or shortness of breath.    [provider]  Fluticasone-Salmeterol (ADVAIR) 250-50 MCG/DOSE AEPB Inhale 1 puff into the lungs daily.    [provider]  tiotropium (SPIRIVA) 18 MCG inhalation capsule Place 18 mcg into inhaler and inhale daily.    [provider]    Allergies Penicillins  Family History  Problem Relation Age of Onset  . Diabetes Mother   . Heart failure Mother   . Heart attack Father     Social History Social History   Tobacco Use  . Smoking status: Current Every Day Smoker    Packs/day: 0.50    Years: 30.00    Pack years: 15.00  . Smokeless tobacco: Never Used  Substance Use Topics  . Alcohol use: No  . Drug use: No    Review of Systems  Constitutional: Positive fever/chills Eyes: No visual changes. ENT: No sore throat. Respiratory: Positive cough Cardiovascular: Denies chest pain Gastrointestinal: Denies abdominal pain Genitourinary: Negative for dysuria. Musculoskeletal: Positive for back pain. Skin: Negative for rash. Psychiatric: no mood changes,     ____________________________________________   PHYSICAL EXAM:  VITAL SIGNS: ED Triage Vitals  Enc Vitals Group  BP 05/10/20 2004 136/83     Pulse Rate 05/10/20 2004 85     Resp 05/10/20 2004 18     Temp 05/10/20 2004 98.2 F (36.8 C)     Temp src --      SpO2 05/10/20 2004 94 %     Weight 05/10/20 2004 298 lb (135.2 kg)     Height 05/10/20 2004 5\' 4"  (1.626 m)     Head Circumference --      Peak Flow --      Pain Score 05/10/20 2011 7     Pain Loc --      Pain Edu? --      Excl. in GC? --     Constitutional: Alert and oriented. Well appearing and in no acute distress. Eyes: Conjunctivae are normal.  Head: Atraumatic. Nose: No  congestion/rhinnorhea. Mouth/Throat: Mucous membranes are moist.  Neck:  supple no lymphadenopathy noted Cardiovascular: Normal rate, regular rhythm. Heart sounds are normal Respiratory: Normal respiratory effort.  No retractions, lungs c t a  GU: deferred Musculoskeletal: FROM all extremities, warm and well perfused. Tender in lumbar spine and paravertebral muscles Neurologic:  Normal speech and language.  Skin:  Skin is warm, dry and intact. No rash noted. Psychiatric: Mood and affect are normal. Speech and behavior are normal.  ____________________________________________   LABS (all labs ordered are listed, but only abnormal results are displayed)  Labs Reviewed  SARS CORONAVIRUS 2 (TAT 6-24 HRS)   ____________________________________________   ____________________________________________  RADIOLOGY  Chest x-ray X-ray lumbar spine  ____________________________________________   PROCEDURES  Procedure(s) performed: No  Procedures    ____________________________________________   INITIAL IMPRESSION / ASSESSMENT AND PLAN / ED COURSE  Pertinent labs & imaging results that were available during my care of the patient were reviewed by me and considered in my medical decision making (see chart for details).   Patient is 50 year old female presents with multiple complaints.  See HPI.  Physical exam shows muscles that are tender in the lumbar spine, lumbar spine was done, examination unremarkable  Chest x-ray reviewed by me is normal when compared to her old cxr.  Confirmed by radiology X-ray of the lumbar spine appears to have no acute abnormality.  Confirmed by radiology  I did discuss all findings with patient.  She was given a prescription for doxycycline, Medrol Dosepak, baclofen, and tramadol.  She is to follow-up with her regular doctor if not improving in 3 to 5 days.  Return emergency department worsening.  She is to quarantine if her Covid test returns is  positive.  She states she understands.  She is discharged stable condition.     Hannah Shah was evaluated in Emergency Department on 05/10/2020 for the symptoms described in the history of present illness. She was evaluated in the context of the global COVID-19 pandemic, which necessitated consideration that the patient might be at risk for infection with the SARS-CoV-2 virus that causes COVID-19. Institutional protocols and algorithms that pertain to the evaluation of patients at risk for COVID-19 are in a state of rapid change based on information released by regulatory bodies including the CDC and federal and state organizations. These policies and algorithms were followed during the patient's care in the ED.    As part of my medical decision making, I reviewed the following data within the electronic MEDICAL RECORD NUMBER Nursing notes reviewed and incorporated, Old chart reviewed, Radiograph reviewed , Notes from prior ED visits and [place] Controlled Substance Database  ____________________________________________  FINAL CLINICAL IMPRESSION(S) / ED DIAGNOSES  Final diagnoses:  Acute midline low back pain without sciatica  Suspected COVID-19 virus infection      NEW MEDICATIONS STARTED DURING THIS VISIT:  New Prescriptions   BACLOFEN (LIORESAL) 10 MG TABLET    Take 1 tablet (10 mg total) by mouth 3 (three) times daily for 10 days.   DOXYCYCLINE (VIBRAMYCIN) 100 MG CAPSULE    Take 1 capsule (100 mg total) by mouth 2 (two) times daily.   METHYLPREDNISOLONE (MEDROL DOSEPAK) 4 MG TBPK TABLET    Take 6 pills on day one then decrease by 1 pill each day   TRAMADOL (ULTRAM) 50 MG TABLET    Take 1 tablet (50 mg total) by mouth every 6 (six) hours as needed.     Note:  This document was prepared using Dragon voice recognition software and may include unintentional dictation errors.    Faythe Ghee, PA-C 05/10/20 2347    Merwyn Katos, MD 05/13/20 [phone]

## 2020-05-11 LAB — SARS CORONAVIRUS 2 (TAT 6-24 HRS): SARS Coronavirus 2: NEGATIVE

## 2020-12-16 ENCOUNTER — Other Ambulatory Visit: Payer: Self-pay

## 2020-12-16 ENCOUNTER — Encounter: Payer: Self-pay | Admitting: Emergency Medicine

## 2020-12-16 ENCOUNTER — Ambulatory Visit
Admission: EM | Admit: 2020-12-16 | Discharge: 2020-12-16 | Disposition: A | Discharge status: Home / Self Care | Payer: Medicaid Other | Attending: Family Medicine | Admitting: Family Medicine

## 2020-12-16 DIAGNOSIS — H6123 Impacted cerumen, bilateral: Secondary | ICD-10-CM

## 2020-12-16 DIAGNOSIS — J0100 Acute maxillary sinusitis, unspecified: Secondary | ICD-10-CM

## 2020-12-16 MED ORDER — DOXYCYCLINE HYCLATE 100 MG PO CAPS: 100.0000 mg | ORAL_CAPSULE | Freq: Two times a day (BID) | ORAL | 0 refills | Status: DC

## 2020-12-16 NOTE — ED Triage Notes (Signed)
Pt is present today with sinus pressure, nasal congestion, cough, right ear fullness, and sneezing,. Pt states that her sx started two weeks ago.

## 2020-12-16 NOTE — ED Provider Notes (Signed)
[facility]    CSN: [id number] Arrival date & time: 12/16/20  1044      History   Chief Complaint Chief Complaint  Patient presents with   Nasal Congestion   Cough   Ear Fullness    HPI 50 year old female presents with respiratory symptoms.  Patient reports that she has had sinus pressure and congestion and associated cough for the past 2 weeks.  Patient reports ongoing ear fullness and feeling clogged for the past month.  Patient endorses shortness of breath but states that she has baseline shortness of breath from COPD.  No fever.  She feels poorly.  No relieving factors.  No known exacerbating factors.  No other complaints.   Past Medical History:  Diagnosis Date   COPD (chronic obstructive pulmonary disease) (HCC)    DDD (degenerative disc disease), cervical    H. pylori infection    Spinal stenosis     Patient Active Problem List   Diagnosis Date Noted   Flu 06/15/2016    Past Surgical History:  Procedure Laterality Date   TUBAL LIGATION  1994   TUBAL LIGATION      OB History   No obstetric history on file.      Home Medications    Prior to Admission medications   Medication Sig Start Date End Date Taking? Authorizing Provider  doxycycline (VIBRAMYCIN) 100 MG capsule Take 1 capsule (100 mg total) by mouth 2 (two) times daily. 12/16/20  Yes Barbi Kumagai G, DO  albuterol (PROVENTIL HFA;VENTOLIN HFA) 108 (90 Base) MCG/ACT inhaler Inhale 2 puffs into the lungs every 4 (four) hours as needed for wheezing or shortness of breath.    [provider]  Fluticasone-Salmeterol (ADVAIR) 250-50 MCG/DOSE AEPB Inhale 1 puff into the lungs daily.    [provider]  tiotropium (SPIRIVA) 18 MCG inhalation capsule Place 18 mcg into inhaler and inhale daily.    [provider]  traMADol (ULTRAM) 50 MG tablet Take 1 tablet (50 mg total) by mouth every 6 (six) hours as needed. 05/10/20   Sherrie Mustache Roselyn Bering, PA-C    Family History Family  History  Problem Relation Age of Onset   Diabetes Mother    Heart failure Mother    Heart attack Father     Social History Social History   Tobacco Use   Smoking status: Every Day    Packs/day: 0.50    Years: 30.00    Pack years: 15.00    Types: Cigarettes   Smokeless tobacco: Never  Substance Use Topics   Alcohol use: No   Drug use: No     Allergies   Penicillins   Review of Systems Review of Systems Per HPI   Physical Exam Triage Vital Signs ED Triage Vitals  Enc Vitals Group     BP 12/16/20 1143 (!) 102/47     Pulse Rate 12/16/20 1143 82     Resp 12/16/20 1143 17     Temp 12/16/20 1143 97.6 F (36.4 C)     Temp src --      SpO2 12/16/20 1143 97 %     Weight --      Height --      Head Circumference --      Peak Flow --      Pain Score 12/16/20 1142 0     Pain Loc --      Pain Edu? --      Excl. in GC? --    Updated Vital  Signs BP (!) 102/47   Pulse 82   Temp 97.6 F (36.4 C)   Resp 17   SpO2 97%   Visual Acuity Right Eye Distance:   Left Eye Distance:   Bilateral Distance:    Right Eye Near:   Left Eye Near:    Bilateral Near:     Physical Exam Constitutional:      General: She is not in acute distress. HENT:     Head: Normocephalic and atraumatic.     Ears:     Comments: Bilateral cerumen impaction.    Nose:     Comments: Maxillary sinus tenderness to palpation.    Mouth/Throat:     Pharynx: Oropharynx is clear.  Eyes:     General:        Right eye: No discharge.        Left eye: No discharge.     Conjunctiva/sclera: Conjunctivae normal.  Cardiovascular:     Rate and Rhythm: Normal rate and regular rhythm.  Pulmonary:     Effort: Pulmonary effort is normal.     Breath sounds: Normal breath sounds. No wheezing or rales.  Neurological:     Mental Status: She is alert.     UC Treatments / Results  Labs (all labs ordered are listed, but only abnormal results are displayed) Labs Reviewed - No data to  display  EKG   Radiology No results found.  Procedures Procedures (including critical care time)  Medications Ordered in UC Medications - No data to display  Initial Impression / Assessment and Plan / UC Course  I have reviewed the triage vital signs and the nursing notes.  Pertinent labs & imaging results that were available during my care of the patient were reviewed by me and considered in my medical decision making (see chart for details).    50 year old female presents with acute sinusitis.  Also has cerumen impaction.  Lavage done today with improvement but no complete resolution of the right cerumen impaction.  Advise Debrox.  Doxycycline as prescribed for sinusitis.  Final Clinical Impressions(s) / UC Diagnoses   Final diagnoses:  Acute maxillary sinusitis, recurrence not specified  Bilateral impacted cerumen   Discharge Instructions   None    ED Prescriptions     Medication Sig Dispense Auth. Provider   doxycycline (VIBRAMYCIN) 100 MG capsule Take 1 capsule (100 mg total) by mouth 2 (two) times daily. 14 capsule Everlene Other G, DO      PDMP not reviewed this encounter.   Tommie Sams, [place] 12/16/20 1251

## 2021-03-17 ENCOUNTER — Encounter: Payer: Self-pay | Admitting: Emergency Medicine

## 2021-03-17 ENCOUNTER — Other Ambulatory Visit: Payer: Self-pay

## 2021-03-17 ENCOUNTER — Ambulatory Visit
Admission: EM | Admit: 2021-03-17 | Discharge: 2021-03-17 | Disposition: A | Discharge status: Home / Self Care | Payer: Medicaid Other | Attending: Emergency Medicine | Admitting: Emergency Medicine

## 2021-03-17 DIAGNOSIS — J069 Acute upper respiratory infection, unspecified: Secondary | ICD-10-CM

## 2021-03-17 MED ORDER — PROMETHAZINE-DM 6.25-15 MG/5ML PO SYRP: 5.0000 mL | ORAL_SOLUTION | Freq: Four times a day (QID) | ORAL | 0 refills | Status: DC | PRN

## 2021-03-17 NOTE — ED Provider Notes (Signed)
[facility]    CSN: [id number] Arrival date & time: 03/17/21  9417      History   Chief Complaint Chief Complaint  Patient presents with   Cough   Facial Pain   Shortness of Breath    HPI Hannah Shah is a 50 y.o. female.   Pt c/o cough, facial pain, shortness of breath(hx of COPD, smokes 1/2 ppd) x 3 days. Pt deneis Cp, palpitations, Afebrile, has been taking tylenol/ibuprofen for headache and facial pain without relief, last took ibuprofen at 0630.   The history is provided by the patient. No language interpreter was used.   Past Medical History:  Diagnosis Date   COPD (chronic obstructive pulmonary disease) (HCC)    DDD (degenerative disc disease), cervical    H. pylori infection    Spinal stenosis     Patient Active Problem List   Diagnosis Date Noted   Viral URI with cough 03/17/2021   Flu 06/15/2016    Past Surgical History:  Procedure Laterality Date   TUBAL LIGATION  1994   TUBAL LIGATION      OB History   No obstetric history on file.      Home Medications    Prior to Admission medications   Medication Sig Start Date End Date Taking? Authorizing Provider  albuterol (PROVENTIL HFA;VENTOLIN HFA) 108 (90 Base) MCG/ACT inhaler Inhale 2 puffs into the lungs every 4 (four) hours as needed for wheezing or shortness of breath.   Yes [provider]  promethazine-dextromethorphan (PROMETHAZINE-DM) 6.25-15 MG/5ML syrup Take 5 mLs by mouth 4 (four) times daily as needed for cough. 03/17/21  Yes Shakiya Mcneary, Para March, NP  doxycycline (VIBRAMYCIN) 100 MG capsule Take 1 capsule (100 mg total) by mouth 2 (two) times daily. 12/16/20   Tommie Sams, DO  Fluticasone-Salmeterol (ADVAIR) 250-50 MCG/DOSE AEPB Inhale 1 puff into the lungs daily.    [provider]  tiotropium (SPIRIVA) 18 MCG inhalation capsule Place 18 mcg into inhaler and inhale daily.    [provider]  traMADol (ULTRAM) 50 MG tablet Take 1 tablet (50 mg  total) by mouth every 6 (six) hours as needed. 05/10/20   Faythe Ghee, PA-C    Family History Family History  Problem Relation Age of Onset   Diabetes Mother    Heart failure Mother    Heart attack Father     Social History Social History   Tobacco Use   Smoking status: Every Day    Packs/day: 0.50    Years: 30.00    Pack years: 15.00    Types: Cigarettes   Smokeless tobacco: Never  Substance Use Topics   Alcohol use: No   Drug use: No     Allergies   Penicillins   Review of Systems Review of Systems  HENT:  Positive for congestion.   Respiratory:  Positive for cough and shortness of breath.   All other systems reviewed and are negative.   Physical Exam Triage Vital Signs ED Triage Vitals [03/17/21 1038]  Enc Vitals Group     BP 118/81     Pulse Rate 82     Resp (!) 22     Temp 98 F (36.7 C)     Temp Source Oral     SpO2 96 %     Weight      Height      Head Circumference      Peak Flow      Pain Score 9  Pain Loc      Pain Edu?      Excl. in [place]?    No data found.  Updated Vital Signs BP 118/81 (BP Location: Right Arm)    Pulse 82    Temp 98 F (36.7 C) (Oral)    Resp (!) 22    LMP 03/13/2021 (Exact Date)    SpO2 96%   Visual Acuity Right Eye Distance:   Left Eye Distance:   Bilateral Distance:    Right Eye Near:   Left Eye Near:    Bilateral Near:     Physical Exam Vitals and nursing note reviewed.  Constitutional:      General: She is not in acute distress.    Appearance: She is well-developed.  HENT:     Head: Normocephalic.     Right Ear: Tympanic membrane is retracted.     Left Ear: Tympanic membrane is retracted.     Nose: Congestion present.     Mouth/Throat:     Lips: Pink.     Mouth: Mucous membranes are moist.     Pharynx: Oropharynx is clear.  Eyes:     General: Lids are normal.     Conjunctiva/sclera: Conjunctivae normal.     Pupils: Pupils are equal, round, and reactive to light.  Neck:     Trachea: No  tracheal deviation.  Cardiovascular:     Rate and Rhythm: Normal rate and regular rhythm.     Pulses: Normal pulses.     Heart sounds: Normal heart sounds. No murmur heard. Pulmonary:     Effort: Pulmonary effort is normal.     Breath sounds: Normal breath sounds and air entry.  Abdominal:     General: Bowel sounds are normal.     Palpations: Abdomen is soft.     Tenderness: There is no abdominal tenderness.  Musculoskeletal:        General: Normal range of motion.     Cervical back: Normal range of motion.  Lymphadenopathy:     Cervical: No cervical adenopathy.  Skin:    General: Skin is warm and dry.     Findings: No rash.  Neurological:     General: No focal deficit present.     Mental Status: She is alert and oriented to person, place, and time.     GCS: GCS eye subscore is 4. GCS verbal subscore is 5. GCS motor subscore is 6.  Psychiatric:        Attention and Perception: Attention normal.        Mood and Affect: Mood normal.        Speech: Speech normal.        Behavior: Behavior normal. Behavior is cooperative.     UC Treatments / Results  Labs (all labs ordered are listed, but only abnormal results are displayed) Labs Reviewed - No data to display  EKG   Radiology No results found.  Procedures Procedures (including critical care time)  Medications Ordered in UC Medications - No data to display  Initial Impression / Assessment and Plan / UC Course  I have reviewed the triage vital signs and the nursing notes.  Pertinent labs & imaging results that were available during my care of the patient were reviewed by me and considered in my medical decision making (see chart for details).     Ddx: Viral illness, allergies Final Clinical Impressions(s) / UC Diagnoses   Final diagnoses:  Viral URI with cough     Discharge Instructions  Rest,push fluids, follow up with [facility] or PCP of your choice. Stop smoking. Use flonase nasal spray and  OTC meds for symptom management. No antibiotic indicated at present. Take cough med as directed, may  make you sleepy.      ED Prescriptions     Medication Sig Dispense Auth. Provider   promethazine-dextromethorphan (PROMETHAZINE-DM) 6.25-15 MG/5ML syrup Take 5 mLs by mouth 4 (four) times daily as needed for cough. 123456 mL Hannah Shah, Jeanett Schlein, NP      PDMP not reviewed this encounter.   Tori Milks, NP [phone]

## 2021-03-17 NOTE — Discharge Instructions (Signed)
Rest,push fluids, follow up with [facility] or PCP of your choice. Stop smoking. Use flonase nasal spray and OTC meds for symptom management. No antibiotic indicated at present. Take cough med as directed, may  make you sleepy.

## 2021-03-17 NOTE — ED Triage Notes (Addendum)
Pt presents today with c/o of nasal congestion, sinus pressure, cough, SOB (COPD) x 3 days. She took Ibuprofen 400 mg at 6:30 a.m today. *Headache 9/10, pt offered Tylenol per standing orders, she declined.

## 2021-05-28 ENCOUNTER — Encounter: Payer: Self-pay | Admitting: Emergency Medicine

## 2021-05-28 ENCOUNTER — Other Ambulatory Visit: Payer: Self-pay

## 2021-05-28 ENCOUNTER — Emergency Department: Payer: Self-pay

## 2021-05-28 DIAGNOSIS — Z7951 Long term (current) use of inhaled steroids: Secondary | ICD-10-CM | POA: Insufficient documentation

## 2021-05-28 DIAGNOSIS — R112 Nausea with vomiting, unspecified: Secondary | ICD-10-CM | POA: Insufficient documentation

## 2021-05-28 DIAGNOSIS — J449 Chronic obstructive pulmonary disease, unspecified: Secondary | ICD-10-CM | POA: Insufficient documentation

## 2021-05-28 DIAGNOSIS — R197 Diarrhea, unspecified: Secondary | ICD-10-CM | POA: Insufficient documentation

## 2021-05-28 DIAGNOSIS — R8289 Other abnormal findings on cytological and histological examination of urine: Secondary | ICD-10-CM | POA: Insufficient documentation

## 2021-05-28 DIAGNOSIS — R1084 Generalized abdominal pain: Secondary | ICD-10-CM | POA: Insufficient documentation

## 2021-05-28 DIAGNOSIS — D72829 Elevated white blood cell count, unspecified: Secondary | ICD-10-CM | POA: Insufficient documentation

## 2021-05-28 LAB — CBC
HCT: 42.1 % (ref 36.0–46.0)
Hemoglobin: 14.1 g/dL (ref 12.0–15.0)
MCH: 31.3 pg (ref 26.0–34.0)
MCHC: 33.5 g/dL (ref 30.0–36.0)
MCV: 93.3 fL (ref 80.0–100.0)
Platelets: 344 10*3/uL (ref 150–400)
RBC: 4.51 MIL/uL (ref 3.87–5.11)
RDW: 13 % (ref 11.5–15.5)
WBC: 11.8 10*3/uL — ABNORMAL HIGH (ref 4.0–10.5)
nRBC: 0 % (ref 0.0–0.2)

## 2021-05-28 LAB — COMPREHENSIVE METABOLIC PANEL WITH GFR
ALT: 30 U/L (ref 0–44)
AST: 28 U/L (ref 15–41)
Albumin: 4.3 g/dL (ref 3.5–5.0)
Alkaline Phosphatase: 41 U/L (ref 38–126)
Anion gap: 8 (ref 5–15)
BUN: 14 mg/dL (ref 6–20)
CO2: 24 mmol/L (ref 22–32)
Calcium: 9.2 mg/dL (ref 8.9–10.3)
Chloride: 105 mmol/L (ref 98–111)
Creatinine, Ser: 0.9 mg/dL (ref 0.44–1.00)
GFR, Estimated: >60 mL/min
Glucose, Bld: 101 mg/dL — ABNORMAL HIGH (ref 70–99)
Potassium: 3.9 mmol/L (ref 3.5–5.1)
Sodium: 137 mmol/L (ref 135–145)
Total Bilirubin: 0.7 mg/dL (ref 0.3–1.2)
Total Protein: 7 g/dL (ref 6.5–8.1)

## 2021-05-28 LAB — LIPASE, BLOOD: Lipase: 39 U/L (ref 11–51)

## 2021-05-28 MED ORDER — ONDANSETRON 4 MG PO TBDP
4.0000 mg | ORAL_TABLET | Freq: Once | ORAL | Status: AC | PRN
  Administered 2021-05-28: 4 mg via ORAL
  Filled 2021-05-28: qty 1

## 2021-05-28 NOTE — ED Triage Notes (Signed)
Pt to ED via POV with c/o abd pain and emesis. Pt states started earlier today. Pt also c/o diarrhea. Pt c/o epigastric abd pain to the left that radiates into her belly button. Pt noted to be actively vomiting in triage.

## 2021-05-29 ENCOUNTER — Emergency Department
Admission: EM | Admit: 2021-05-29 | Discharge: 2021-05-29 | Disposition: A | Discharge status: Home / Self Care | Payer: Self-pay | Attending: Emergency Medicine | Admitting: Emergency Medicine

## 2021-05-29 DIAGNOSIS — R112 Nausea with vomiting, unspecified: Secondary | ICD-10-CM

## 2021-05-29 LAB — URINALYSIS, ROUTINE W REFLEX MICROSCOPIC
Bacteria, UA: NONE SEEN
Bilirubin Urine: NEGATIVE
Glucose, UA: NEGATIVE mg/dL
Ketones, ur: NEGATIVE mg/dL
Leukocytes,Ua: NEGATIVE
Nitrite: NEGATIVE
Protein, ur: NEGATIVE mg/dL
Specific Gravity, Urine: >1.046 — ABNORMAL HIGH (ref 1.005–1.030)
pH: 6 (ref 5.0–8.0)

## 2021-05-29 LAB — PREGNANCY, URINE: Preg Test, Ur: NEGATIVE

## 2021-05-29 MED ORDER — DICYCLOMINE HCL 20 MG PO TABS: 20.0000 mg | ORAL_TABLET | Freq: Three times a day (TID) | ORAL | 0 refills | Status: DC | PRN

## 2021-05-29 MED ORDER — KETOROLAC TROMETHAMINE 30 MG/ML IJ SOLN
30.0000 mg | Freq: Once | INTRAMUSCULAR | Status: AC
  Administered 2021-05-29: 30 mg via INTRAVENOUS
  Filled 2021-05-29: qty 1

## 2021-05-29 MED ORDER — SODIUM CHLORIDE 0.9 % IV BOLUS (SEPSIS)
1000.0000 mL | Freq: Once | INTRAVENOUS | Status: AC
Start: 2021-05-29 — End: 2021-05-29
  Administered 2021-05-29: 1000 mL via INTRAVENOUS

## 2021-05-29 MED ORDER — ONDANSETRON HCL 4 MG/2ML IJ SOLN
4.0000 mg | Freq: Once | INTRAMUSCULAR | Status: AC
  Administered 2021-05-29: 4 mg via INTRAVENOUS
  Filled 2021-05-29: qty 2

## 2021-05-29 MED ORDER — ONDANSETRON 4 MG PO TBDP
4.0000 mg | ORAL_TABLET | Freq: Four times a day (QID) | ORAL | 0 refills | Status: DC | PRN
Start: 2021-05-29 — End: 2021-06-28

## 2021-05-29 MED ORDER — IOHEXOL 300 MG/ML  SOLN
100.0000 mL | Freq: Once | INTRAMUSCULAR | Status: AC | PRN
  Administered 2021-05-29: 100 mL via INTRAVENOUS

## 2021-05-29 MED ORDER — DICYCLOMINE HCL 10 MG/ML IM SOLN
20.0000 mg | Freq: Once | INTRAMUSCULAR | Status: AC
  Administered 2021-05-29: 20 mg via INTRAMUSCULAR
  Filled 2021-05-29 (×3): qty 2

## 2021-05-29 NOTE — ED Provider Notes (Signed)
[facility] Provider Note    Event Date/Time   First MD Initiated Contact with Patient 05/29/21 [phone]     (approximate)   History   Abdominal Pain and Emesis   HPI  Hannah Shah is a 51 y.o. female with history of COPD who presents to the emergency department with her family for complaints of diffuse crampy abdominal pain, nausea, vomiting and diarrhea today.  No sick contacts or recent travel.  No fever.  No dysuria, vaginal bleeding or discharge.   History provided by patient and family.    Past Medical History:  Diagnosis Date   COPD (chronic obstructive pulmonary disease) ([place])    DDD (degenerative disc disease), cervical    H. pylori infection    Spinal stenosis     Past Surgical History:  Procedure Laterality Date   TUBAL LIGATION  1994   TUBAL LIGATION      MEDICATIONS:  Prior to Admission medications   Medication Sig Start Date End Date Taking? Authorizing Provider  albuterol (PROVENTIL HFA;VENTOLIN HFA) 108 (90 Base) MCG/ACT inhaler Inhale 2 puffs into the lungs every 4 (four) hours as needed for wheezing or shortness of breath.    [provider]  doxycycline (VIBRAMYCIN) 100 MG capsule Take 1 capsule (100 mg total) by mouth 2 (two) times daily. 12/16/20   Coral Spikes, DO  Fluticasone-Salmeterol (ADVAIR) 250-50 MCG/DOSE AEPB Inhale 1 puff into the lungs daily.    [provider]  promethazine-dextromethorphan (PROMETHAZINE-DM) 6.25-15 MG/5ML syrup Take 5 mLs by mouth 4 (four) times daily as needed for cough. 0000000   Defelice, Jeanett Schlein, NP  tiotropium (SPIRIVA) 18 MCG inhalation capsule Place 18 mcg into inhaler and inhale daily.    [provider]  traMADol (ULTRAM) 50 MG tablet Take 1 tablet (50 mg total) by mouth every 6 (six) hours as needed. 05/10/20   Versie Starks, PA-C    Physical Exam   Triage Vital Signs: ED Triage Vitals  Enc Vitals Group     BP 05/28/21 2306 101/84     Pulse Rate  05/28/21 2306 (!) 115     Resp 05/28/21 2306 (!) 24     Temp 05/28/21 2306 99 F (37.2 C)     Temp Source 05/28/21 2306 Oral     SpO2 05/28/21 2306 96 %     Weight 05/28/21 2303 239 lb (108.4 kg)     Height 05/28/21 2303 5\' 4"  (1.626 m)     Head Circumference --      Peak Flow --      Pain Score 05/28/21 2303 10     Pain Loc --      Pain Edu? --      Excl. in [place]? --     Most recent vital signs: Vitals:   05/29/21 0215 05/29/21 0230  BP: 136/62 109/67  Pulse: 85 79  Resp:  16  Temp:    SpO2: 99% 98%    CONSTITUTIONAL: Alert and oriented and responds appropriately to questions. Well-appearing; well-nourished HEAD: Normocephalic, atraumatic EYES: Conjunctivae clear, pupils appear equal, sclera nonicteric ENT: normal nose; slightly dry appearing mucous membranes NECK: Supple, normal ROM CARD: Regular and tachycardic; S1 and S2 appreciated; no murmurs, no clicks, no rubs, no gallops RESP: Normal chest excursion without splinting or tachypnea; breath sounds clear and equal bilaterally; no wheezes, no rhonchi, no rales, no hypoxia or respiratory distress, speaking full sentences ABD/GI: Normal bowel sounds; non-distended; soft, tender to palpation diffusely with  voluntary guarding, no rebound BACK: The back appears normal EXT: Normal ROM in all joints; no deformity noted, no edema; no cyanosis SKIN: Normal color for age and race; warm; no rash on exposed skin NEURO: Moves all extremities equally, normal speech PSYCH: The patient's mood and manner are appropriate.   ED Results / Procedures / Treatments   LABS: (all labs ordered are listed, but only abnormal results are displayed) Labs Reviewed  COMPREHENSIVE METABOLIC PANEL - Abnormal; Notable for the following components:      Result Value   Glucose, Bld 101 (*)    All other components within normal limits  CBC - Abnormal; Notable for the following components:   WBC 11.8 (*)    All other components within normal limits   URINALYSIS, ROUTINE W REFLEX MICROSCOPIC - Abnormal; Notable for the following components:   Color, Urine YELLOW (*)    APPearance CLEAR (*)    Specific Gravity, Urine >1.046 (*)    Hgb urine dipstick MODERATE (*)    All other components within normal limits  LIPASE, BLOOD  PREGNANCY, URINE     EKG:   EKG Interpretation  Date/Time:  Saturday May 28 2021 23:10:14 EST Ventricular Rate:  105 PR Interval:  146 QRS Duration: 82 QT Interval:  326 QTC Calculation: 430 R Axis:   -2 Text Interpretation: Sinus tachycardia Otherwise normal ECG When compared with ECG of 26-Nov-2019 10:31, No significant change was found Confirmed by Pryor Curia [phone]) on 05/29/2021 3:26:23 AM          RADIOLOGY: My personal review and interpretation of imaging: CT scan shows enteritis.  I have personally reviewed all radiology reports.   CT ABDOMEN PELVIS W CONTRAST  Result Date: 05/29/2021 CLINICAL DATA:  Abdominal pain. EXAM: CT ABDOMEN AND PELVIS WITH CONTRAST TECHNIQUE: Multidetector CT imaging of the abdomen and pelvis was performed using the standard protocol following bolus administration of intravenous contrast. RADIATION DOSE REDUCTION: This exam was performed according to the departmental dose-optimization program which includes automated exposure control, adjustment of the mA and/or kV according to patient size and/or use of iterative reconstruction technique. CONTRAST:  132mL OMNIPAQUE IOHEXOL 300 MG/ML  SOLN COMPARISON:  CT abdomen pelvis dated 11/08/2019. FINDINGS: Lower chest: The visualized lung bases are clear. 73 no intra-abdominal free air.  Trace free fluid in the pelvis. Hepatobiliary: Mild fatty liver. No intrahepatic biliary dilatation. The gallbladder is unremarkable. Pancreas: Unremarkable. No pancreatic ductal dilatation or surrounding inflammatory changes. Spleen: Normal in size without focal abnormality. Adrenals/Urinary Tract: The adrenal glands unremarkable. The  kidneys, visualized ureters, and urinary bladder appear unremarkable. Stomach/Bowel: Mildly thickened appearance of several loops of distal small bowel in the lower abdomen which may be related to underdistention. There is however mild engorgement of the associated mesentery. Clinical correlation is recommended to evaluate for possibility of enteritis. There is no bowel obstruction. The appendix is normal. Vascular/Lymphatic: The abdominal aorta and IVC are unremarkable. No portal venous gas. There is no adenopathy. Reproductive: The uterus is anteverted and grossly unremarkable. No adnexal masses. Other: None Musculoskeletal: No acute or significant osseous findings. IMPRESSION: 1. Probable mild enteritis. Clinical correlation is recommended. No bowel obstruction. Normal appendix. 2. Mild fatty liver. Electronically Signed   By: Anner Crete M.D.   On: 05/29/2021 00:30     PROCEDURES:  Critical Care performed: No   CRITICAL CARE Performed by: Cyril Mourning Pecolia Marando   Total critical care time: 0 minutes  Critical care time was exclusive of separately billable procedures and treating  other patients.  Critical care was necessary to treat or prevent imminent or life-threatening deterioration.  Critical care was time spent personally by me on the following activities: development of treatment plan with patient and/or surrogate as well as nursing, discussions with consultants, evaluation of patient's response to treatment, examination of patient, obtaining history from patient or surrogate, ordering and performing treatments and interventions, ordering and review of laboratory studies, ordering and review of radiographic studies, pulse oximetry and re-evaluation of patient's condition.   Procedures    IMPRESSION / MDM / ASSESSMENT AND PLAN / ED COURSE  I reviewed the triage vital signs and the nursing notes.    Patient here with abdominal pain, vomiting and diarrhea.     DIFFERENTIAL  DIAGNOSIS (includes but not limited to):   Viral gastroenteritis, gastritis, cholecystitis, colitis, diverticulitis, appendicitis, bowel obstruction, UTI, kidney stone, pyelonephritis   PLAN: We will obtain CBC, CMP, lipase, urinalysis, CT of the abdomen pelvis.  Will give IV fluids, Toradol, Zofran and Bentyl for symptomatic relief.   MEDICATIONS GIVEN IN ED: Medications  sodium chloride 0.9 % bolus 1,000 mL (has no administration in time range)  ondansetron (ZOFRAN) injection 4 mg (has no administration in time range)  ketorolac (TORADOL) 30 MG/ML injection 30 mg (has no administration in time range)  dicyclomine (BENTYL) injection 20 mg (has no administration in time range)  ondansetron (ZOFRAN-ODT) disintegrating tablet 4 mg (4 mg Oral Given 05/28/21 2312)  iohexol (OMNIPAQUE) 300 MG/ML solution 100 mL (100 mLs Intravenous Contrast Given 05/29/21 0011)     ED COURSE: Patient's labs show a mild leukocytosis of 11.8.  Normal electrolytes, renal function, LFTs and lipase.  Urine shows some red blood cells but no other sign of infection.  Pregnancy test is negative.  CT scan of the abdomen pelvis reviewed by myself and radiologist and shows enteritis.  There is no bowel obstruction, appendicitis, colitis or other acute abnormality.  Will p.o. challenge patient.   5:15 AM  Pt reports feeling much better and is tolerating p.o.  Will discharge home with prescriptions of Zofran, Bentyl.  Discussed supportive care instructions and return precautions.   At this time, I do not feel there is any life-threatening condition present. I reviewed all nursing notes, vitals, pertinent previous records.  All lab and urine results, EKGs, imaging ordered have been independently reviewed and interpreted by myself.  I reviewed all available radiology reports from any imaging ordered this visit.  Based on my assessment, I feel the patient is safe to be discharged home without further emergent workup and can continue  workup as an outpatient as needed. Discussed all findings, treatment plan as well as usual and customary return precautions with patient and family.  They verbalize understanding and are comfortable with this plan.  Outpatient follow-up has been provided as needed.  All questions have been answered.   CONSULTS: Admission considered but CT scan showed no surgical pathology and patient tolerating p.o.   OUTSIDE RECORDS REVIEWED: Reviewed patient's last hospitalization for influenza in March 2018.         FINAL CLINICAL IMPRESSION(S) / ED DIAGNOSES   Final diagnoses:  Nausea vomiting and diarrhea     Rx / DC Orders   ED Discharge Orders          Ordered    ondansetron (ZOFRAN-ODT) 4 MG disintegrating tablet  Every 6 hours PRN        05/29/21 0325    dicyclomine (BENTYL) 20 MG tablet  Every 8 hours  PRN        05/29/21 0325             Note:  This document was prepared using Dragon voice recognition software and may include unintentional dictation errors.   Geniva Lohnes, Delice Bison, DO 05/29/21 [phone]

## 2021-05-29 NOTE — ED Notes (Signed)
Patient placed on monitoring equipment and warm blankets provided.

## 2021-05-29 NOTE — ED Notes (Addendum)
Patient ambulated to the bathroom with minimal assistance to attempt to obtain urine specimen. Patient states she has had abdominal pain since noon yesterday. Patient denies nausea or diarrhea at this time but did have some N/V right after her CT scan with contrast. Patient states she has chronic diarrhea so that is no change. She also states she has never had this pain before. When patient ambulated, she was unable to stand up straight.

## 2021-05-29 NOTE — Discharge Instructions (Addendum)
You may alternate Tylenol 1000 mg every 6 hours as needed for pain, fever and Ibuprofen 800 mg every 6-8 hours as needed for pain, fever.  Please take Ibuprofen with food.  Do not take more than 4000 mg of Tylenol (acetaminophen) in a 24 hour period.  You may take over-the-counter Imodium as needed for diarrhea.  Steps to find a Primary Care Provider (PCP):  Call [phone] or [phone] to access "[facility]."  2.  You may also go on the [facility] website at [email or web address]

## 2021-06-28 ENCOUNTER — Emergency Department
Admission: EM | Admit: 2021-06-28 | Discharge: 2021-06-28 | Disposition: A | Discharge status: Home / Self Care | Payer: Self-pay | Attending: Emergency Medicine | Admitting: Emergency Medicine

## 2021-06-28 ENCOUNTER — Encounter: Payer: Self-pay | Admitting: Emergency Medicine

## 2021-06-28 ENCOUNTER — Other Ambulatory Visit: Payer: Self-pay

## 2021-06-28 DIAGNOSIS — J328 Other chronic sinusitis: Secondary | ICD-10-CM | POA: Insufficient documentation

## 2021-06-28 DIAGNOSIS — B9689 Other specified bacterial agents as the cause of diseases classified elsewhere: Secondary | ICD-10-CM

## 2021-06-28 DIAGNOSIS — H6123 Impacted cerumen, bilateral: Secondary | ICD-10-CM | POA: Insufficient documentation

## 2021-06-28 DIAGNOSIS — R42 Dizziness and giddiness: Secondary | ICD-10-CM

## 2021-06-28 MED ORDER — MECLIZINE HCL 25 MG PO TABS
25.0000 mg | ORAL_TABLET | Freq: Once | ORAL | Status: AC
  Administered 2021-06-28: 25 mg via ORAL
  Filled 2021-06-28: qty 1

## 2021-06-28 MED ORDER — DOXYCYCLINE HYCLATE 100 MG PO TABS
100.0000 mg | ORAL_TABLET | Freq: Once | ORAL | Status: AC
  Administered 2021-06-28: 100 mg via ORAL
  Filled 2021-06-28: qty 1

## 2021-06-28 MED ORDER — MECLIZINE HCL 25 MG PO TABS: 25.0000 mg | ORAL_TABLET | Freq: Three times a day (TID) | ORAL | 0 refills | Status: AC | PRN

## 2021-06-28 MED ORDER — DOXYCYCLINE HYCLATE 100 MG PO TABS: 100.0000 mg | ORAL_TABLET | Freq: Two times a day (BID) | ORAL | 0 refills | Status: DC

## 2021-06-28 MED ORDER — ONDANSETRON 4 MG PO TBDP: 4.0000 mg | ORAL_TABLET | Freq: Three times a day (TID) | ORAL | 0 refills | Status: DC | PRN

## 2021-06-28 NOTE — ED Provider Notes (Signed)
? ?[facility] ?Provider Note ? ?Patient Contact: 4:56 PM (approximate) ? ? ?History  ? ?Headache, Otalgia, Nasal Congestion, Neck Pain (vert), and Dizziness ? ? ?HPI ? ?Hannah Shah is a 51 y.o. female who presents the emergency department with sinus pressure, ear pain, vertigo-like symptoms.  Patient states that she has chronic allergies, states that they have "been about the same" but feels like she has developed into a sinus infection.  Patient denies any fevers or chills, she does have some postnasal drip but no cough.  Patient denies any chest pain, shortness of breath, GI symptoms.  Patient is still taking her allergy medicines of Flonase and Zyrtec but has not tried any medications for the symptoms. ?  ? ? ?Physical Exam  ? ?Triage Vital Signs: ?ED Triage Vitals  ?Enc Vitals Group  ?   BP 06/28/21 1559 135/75  ?   Pulse Rate 06/28/21 1559 68  ?   Resp 06/28/21 1559 17  ?   Temp 06/28/21 1559 97.7 ?F (36.5 ?C)  ?   Temp Source 06/28/21 1559 Oral  ?   SpO2 06/28/21 1559 97 %  ?   Weight 06/28/21 1602 210 lb (95.3 kg)  ?   Height 06/28/21 1602 5\' 4"  (1.626 m)  ?   Head Circumference --   ?   Peak Flow --   ?   Pain Score 06/28/21 1601 8  ?   Pain Loc --   ?   Pain Edu? --   ?   Excl. in [place]? --   ? ? ?Most recent vital signs: ?Vitals:  ? 06/28/21 1559  ?BP: 135/75  ?Pulse: 68  ?Resp: 17  ?Temp: 97.7 ?F (36.5 ?C)  ?SpO2: 97%  ? ? ? ?General: Alert and in no acute distress ?ENT: ?     Ears: EACs with cerumen bilaterally, TMs are visualized.  TM is slightly erythematous and moderately bulging on the right, no erythema or injection of the left but also moderately bulging ?     Nose: No congestion/rhinnorhea.  Patient is extremely tender to percussion over the sinuses specifically the frontal sinus on the right side, less tender on maxillary sinus on the right side.  No tenderness on the left sinuses. ?     Mouth/Throat: Mucous membranes are moist. ?Neck: No stridor. No cervical spine  tenderness to palpation. ?Hematological/Lymphatic/Immunilogical: No cervical lymphadenopathy. ?Cardiovascular:  Good peripheral perfusion ?Respiratory: Normal respiratory effort without tachypnea or retractions. Lungs CTAB. Good air entry to the bases with no decreased or absent breath sounds. ?Musculoskeletal: Full range of motion to all extremities.  ?Neurologic:  No gross focal neurologic deficits are appreciated.  ?Skin:   No rash noted ?Other: ? ? ?ED Results / Procedures / Treatments  ? ?Labs ?(all labs ordered are listed, but only abnormal results are displayed) ?Labs Reviewed - No data to display ? ? ?EKG ? ? ? ? ?RADIOLOGY ? ? ? ?No results found. ? ?PROCEDURES: ? ?Critical Care performed: No ? ?Procedures ? ? ?MEDICATIONS ORDERED IN ED: ?Medications  ?doxycycline (VIBRA-TABS) tablet 100 mg (has no administration in time range)  ?meclizine (ANTIVERT) tablet 25 mg (has no administration in time range)  ? ? ? ?IMPRESSION / MDM / ASSESSMENT AND PLAN / ED COURSE  ?I reviewed the triage vital signs and the nursing notes. ?             ?               ? ?  Differential diagnosis includes, but is not limited to, viral illness, sinusitis, otitis media, eustachian tube dysfunction, vertigo ? ? ?Patient's diagnosis is consistent with sinusitis and vertigo.  Patient presents emergency department with sinus pressure, ear pain, dizziness.  Findings on physical exam are consistent with sinusitis and vertigo.  Patient will be treated with antibiotics, meclizine for her symptoms.  Continue allergy medications.  Return precautions discussed with the patient.  No indication for labs or imaging currently..  Follow-up with primary care as needed patient is given ED precautions to return to the ED for any worsening or new symptoms. ? ? ? ?  ? ? ?FINAL CLINICAL IMPRESSION(S) / ED DIAGNOSES  ? ?Final diagnoses:  ?Bacterial sinusitis  ?Vertigo  ? ? ? ?Rx / DC Orders  ? ?ED Discharge Orders   ? ?      Ordered  ?  doxycycline  (VIBRA-TABS) 100 MG tablet  2 times daily       ? 06/28/21 1659  ?  ondansetron (ZOFRAN-ODT) 4 MG disintegrating tablet  Every 8 hours PRN       ? 06/28/21 1659  ?  meclizine (ANTIVERT) 25 MG tablet  3 times daily PRN       ? 06/28/21 1659  ? ?  ?  ? ?  ? ? ? ?Note:  This document was prepared using Dragon voice recognition software and may include unintentional dictation errors. ?  ?Racheal Patches, PA-C ?06/28/21 1701 ? ?  ?Delton Prairie, MD ?06/28/21 2123 ? ?

## 2021-06-28 NOTE — ED Triage Notes (Addendum)
Pt to ED via POV with c/o headache, congestion, neck pain, nausea, vertigo and bringing up mucous. This started this am. She is also complaining of right ear pain. You can here the congestion in her voice and pt is holding right ear ?

## 2021-09-23 ENCOUNTER — Other Ambulatory Visit: Payer: Self-pay

## 2021-09-23 DIAGNOSIS — W010XXA Fall on same level from slipping, tripping and stumbling without subsequent striking against object, initial encounter: Secondary | ICD-10-CM | POA: Insufficient documentation

## 2021-09-23 DIAGNOSIS — S76311A Strain of muscle, fascia and tendon of the posterior muscle group at thigh level, right thigh, initial encounter: Secondary | ICD-10-CM | POA: Insufficient documentation

## 2021-09-23 DIAGNOSIS — Y9301 Activity, walking, marching and hiking: Secondary | ICD-10-CM | POA: Insufficient documentation

## 2021-09-23 NOTE — ED Triage Notes (Signed)
Pt states she slipped this pm injuring right hip, buttocks, and leg. Pt complains of pain from him down to ankle. Pt denies hitting head.

## 2021-09-24 ENCOUNTER — Other Ambulatory Visit: Payer: Medicaid Other

## 2021-09-24 ENCOUNTER — Emergency Department: Payer: Self-pay

## 2021-09-24 ENCOUNTER — Emergency Department
Admission: EM | Admit: 2021-09-24 | Discharge: 2021-09-24 | Disposition: A | Discharge status: Home / Self Care | Payer: Self-pay | Attending: Emergency Medicine | Admitting: Emergency Medicine

## 2021-09-24 DIAGNOSIS — S76311A Strain of muscle, fascia and tendon of the posterior muscle group at thigh level, right thigh, initial encounter: Secondary | ICD-10-CM

## 2021-09-24 LAB — BASIC METABOLIC PANEL
Anion gap: 5 (ref 5–15)
BUN: 18 mg/dL (ref 6–20)
CO2: 25 mmol/L (ref 22–32)
Calcium: 9.4 mg/dL (ref 8.9–10.3)
Chloride: 109 mmol/L (ref 98–111)
Creatinine, Ser: 1.02 mg/dL — ABNORMAL HIGH (ref 0.44–1.00)
GFR, Estimated: >60 mL/min (ref >60)
Glucose, Bld: 117 mg/dL — ABNORMAL HIGH (ref 70–99)
Potassium: 3.8 mmol/L (ref 3.5–5.1)
Sodium: 139 mmol/L (ref 135–145)

## 2021-09-24 LAB — CBC WITH DIFFERENTIAL/PLATELET
Abs Immature Granulocytes: 0.06 10*3/uL (ref 0.00–0.07)
Basophils Absolute: 0 10*3/uL (ref 0.0–0.1)
Basophils Relative: 0 %
Eosinophils Absolute: 0.1 10*3/uL (ref 0.0–0.5)
Eosinophils Relative: 1 %
HCT: 38.2 % (ref 36.0–46.0)
Hemoglobin: 12.7 g/dL (ref 12.0–15.0)
Immature Granulocytes: 1 %
Lymphocytes Relative: 22 %
Lymphs Abs: 2.1 10*3/uL (ref 0.7–4.0)
MCH: 31.7 pg (ref 26.0–34.0)
MCHC: 33.2 g/dL (ref 30.0–36.0)
MCV: 95.3 fL (ref 80.0–100.0)
Monocytes Absolute: 0.6 10*3/uL (ref 0.1–1.0)
Monocytes Relative: 7 %
Neutro Abs: 6.7 10*3/uL (ref 1.7–7.7)
Neutrophils Relative %: 69 %
Platelets: 253 10*3/uL (ref 150–400)
RBC: 4.01 MIL/uL (ref 3.87–5.11)
RDW: 13.1 % (ref 11.5–15.5)
WBC: 9.5 10*3/uL (ref 4.0–10.5)
nRBC: 0 % (ref 0.0–0.2)

## 2021-09-24 MED ORDER — MORPHINE SULFATE (PF) 4 MG/ML IV SOLN
4.0000 mg | Freq: Once | INTRAVENOUS | Status: AC
  Administered 2021-09-24: 4 mg via INTRAVENOUS
  Filled 2021-09-24: qty 1

## 2021-09-24 MED ORDER — OXYCODONE-ACETAMINOPHEN 5-325 MG PO TABS: 2.0000 | ORAL_TABLET | Freq: Three times a day (TID) | ORAL | 0 refills | Status: DC | PRN

## 2021-09-24 MED ORDER — DOCUSATE SODIUM 100 MG PO CAPS: ORAL_CAPSULE | ORAL | 0 refills | Status: AC

## 2021-09-24 MED ORDER — ONDANSETRON HCL 4 MG/2ML IJ SOLN
4.0000 mg | INTRAMUSCULAR | Status: AC
  Administered 2021-09-24: 4 mg via INTRAVENOUS
  Filled 2021-09-24: qty 2

## 2021-09-24 MED ORDER — IOHEXOL 350 MG/ML SOLN
100.0000 mL | Freq: Once | INTRAVENOUS | Status: AC | PRN
  Administered 2021-09-24: 100 mL via INTRAVENOUS

## 2021-09-24 MED ORDER — OXYCODONE-ACETAMINOPHEN 5-325 MG PO TABS
2.0000 | ORAL_TABLET | Freq: Once | ORAL | Status: AC
  Administered 2021-09-24: 2 via ORAL
  Filled 2021-09-24: qty 2

## 2021-10-24 ENCOUNTER — Emergency Department: Payer: 59

## 2021-10-24 ENCOUNTER — Other Ambulatory Visit: Payer: Self-pay

## 2021-10-24 ENCOUNTER — Emergency Department
Admission: EM | Admit: 2021-10-24 | Discharge: 2021-10-24 | Disposition: A | Discharge status: Home / Self Care | Payer: 59 | Attending: Emergency Medicine | Admitting: Emergency Medicine

## 2021-10-24 ENCOUNTER — Encounter: Payer: Self-pay | Admitting: Intensive Care

## 2021-10-24 DIAGNOSIS — B372 Candidiasis of skin and nail: Secondary | ICD-10-CM | POA: Diagnosis not present

## 2021-10-24 DIAGNOSIS — R350 Frequency of micturition: Secondary | ICD-10-CM | POA: Diagnosis present

## 2021-10-24 DIAGNOSIS — R300 Dysuria: Secondary | ICD-10-CM | POA: Diagnosis not present

## 2021-10-24 DIAGNOSIS — J449 Chronic obstructive pulmonary disease, unspecified: Secondary | ICD-10-CM | POA: Insufficient documentation

## 2021-10-24 DIAGNOSIS — N390 Urinary tract infection, site not specified: Secondary | ICD-10-CM

## 2021-10-24 DIAGNOSIS — R109 Unspecified abdominal pain: Secondary | ICD-10-CM | POA: Diagnosis not present

## 2021-10-24 LAB — COMPREHENSIVE METABOLIC PANEL
ALT: 32 U/L (ref 0–44)
AST: 28 U/L (ref 15–41)
Albumin: 4.8 g/dL (ref 3.5–5.0)
Alkaline Phosphatase: 49 U/L (ref 38–126)
Anion gap: 9 (ref 5–15)
BUN: 15 mg/dL (ref 6–20)
CO2: 26 mmol/L (ref 22–32)
Calcium: 10 mg/dL (ref 8.9–10.3)
Chloride: 103 mmol/L (ref 98–111)
Creatinine, Ser: 1.02 mg/dL — ABNORMAL HIGH (ref 0.44–1.00)
GFR, Estimated: >60 mL/min (ref >60)
Glucose, Bld: 97 mg/dL (ref 70–99)
Potassium: 4.1 mmol/L (ref 3.5–5.1)
Sodium: 138 mmol/L (ref 135–145)
Total Bilirubin: 0.9 mg/dL (ref 0.3–1.2)
Total Protein: 8.1 g/dL (ref 6.5–8.1)

## 2021-10-24 LAB — CBC WITH DIFFERENTIAL/PLATELET
Abs Immature Granulocytes: 0.02 10*3/uL (ref 0.00–0.07)
Basophils Absolute: 0 10*3/uL (ref 0.0–0.1)
Basophils Relative: 0 %
Eosinophils Absolute: 0 10*3/uL (ref 0.0–0.5)
Eosinophils Relative: 0 %
HCT: 43.8 % (ref 36.0–46.0)
Hemoglobin: 14.6 g/dL (ref 12.0–15.0)
Immature Granulocytes: 0 %
Lymphocytes Relative: 27 %
Lymphs Abs: 2.6 10*3/uL (ref 0.7–4.0)
MCH: 31 pg (ref 26.0–34.0)
MCHC: 33.3 g/dL (ref 30.0–36.0)
MCV: 93 fL (ref 80.0–100.0)
Monocytes Absolute: 0.6 10*3/uL (ref 0.1–1.0)
Monocytes Relative: 7 %
Neutro Abs: 6.2 10*3/uL (ref 1.7–7.7)
Neutrophils Relative %: 66 %
Platelets: 301 10*3/uL (ref 150–400)
RBC: 4.71 MIL/uL (ref 3.87–5.11)
RDW: 13.2 % (ref 11.5–15.5)
WBC: 9.5 10*3/uL (ref 4.0–10.5)
nRBC: 0 % (ref 0.0–0.2)

## 2021-10-24 LAB — URINALYSIS, ROUTINE W REFLEX MICROSCOPIC
Bacteria, UA: NONE SEEN
Bilirubin Urine: NEGATIVE
Glucose, UA: NEGATIVE mg/dL
Ketones, ur: 20 mg/dL — AB
Leukocytes,Ua: NEGATIVE
Nitrite: NEGATIVE
Protein, ur: 30 mg/dL — AB
Specific Gravity, Urine: 1.019 (ref 1.005–1.030)
pH: 6 (ref 5.0–8.0)

## 2021-10-24 LAB — LIPASE, BLOOD: Lipase: 34 U/L (ref 11–51)

## 2021-10-24 MED ORDER — NYSTATIN 100000 UNIT/GM EX POWD: 1.0000 | Freq: Three times a day (TID) | CUTANEOUS | 0 refills | Status: AC

## 2021-10-24 MED ORDER — NITROFURANTOIN MONOHYD MACRO 100 MG PO CAPS: 100.0000 mg | ORAL_CAPSULE | Freq: Two times a day (BID) | ORAL | 0 refills | Status: AC

## 2021-10-24 NOTE — ED Triage Notes (Signed)
Patient c/o right lower back pain, fever, chills, and uirnary frequency/burning during urination.

## 2021-10-24 NOTE — ED Provider Triage Note (Signed)
Emergency Medicine Provider Triage Evaluation Note  Hannah Shah , a 51 y.o. female  was evaluated in triage.  Pt complains of right-sided flank pain.  Dysuria..  Review of Systems  Positive: Flank pain, dysuria Negative: Vomiting  Physical Exam  Ht 5\' 5"  (1.651 m)   Wt 106.6 kg   LMP  (LMP Unknown) Comment: pt states is perimenopausal  BMI 39.11 kg/m  Gen:   Awake, no distress   Resp:  Normal effort  MSK:   Moves extremities without difficulty  Other:    Medical Decision Making  Medically screening exam initiated at 2:50 PM.  Appropriate orders placed.  Hannah Shah was informed that the remainder of the evaluation will be completed by another provider, this initial triage assessment does not replace that evaluation, and the importance of remaining in the ED until their evaluation is complete.  Flank pain and dysuria.  We will do CT renal stone to assess for kidney stone and pyelonephritis   Carleene Cooper, PA-C 10/24/21 1451

## 2021-10-24 NOTE — ED Provider Notes (Signed)
[facility] Provider Note    Event Date/Time   First MD Initiated Contact with Patient 10/24/21 1629     (approximate)  History   Chief Complaint: Urinary Frequency  HPI  Hannah Shah is a 51 y.o. female with a past medical history of COPD, recent right hamstring injury 1 month ago currently following up with orthopedics and physical therapy who presents to the emergency department for right flank pain and dysuria.  According to the patient for the past 3 days she has been experiencing pain in her right flank.  She is not sure if it is from her hamstring injury or if she has a kidney infection.  States she has been urinating more frequently and has noticed some burning and discomfort when she urinates recently.  Patient is taking pain medication at home for her hamstring injury and following up with orthopedics.  Patient denies any known fever no vomiting.  Isecondary complaint patient states she is experiencing some erythema and rash in the right groin as well.  Physical Exam   Triage Vital Signs: ED Triage Vitals  Enc Vitals Group     BP 10/24/21 1508 123/75     Pulse Rate 10/24/21 1508 85     Resp 10/24/21 1508 16     Temp 10/24/21 1508 98.3 F (36.8 C)     Temp Source 10/24/21 1508 Oral     SpO2 10/24/21 1508 96 %     Weight 10/24/21 1449 235 lb (106.6 kg)     Height 10/24/21 1449 5\' 5"  (1.651 m)     Head Circumference --      Peak Flow --      Pain Score 10/24/21 1449 9     Pain Loc --      Pain Edu? --      Excl. in [place]? --     Most recent vital signs: Vitals:   10/24/21 1508  BP: 123/75  Pulse: 85  Resp: 16  Temp: 98.3 F (36.8 C)  SpO2: 96%    General: Awake, no distress.  CV:  Good peripheral perfusion.  Regular rate and rhythm  Resp:  Normal effort.  Equal breath sounds bilaterally.  Abd:  No distention.  Soft, nontender.  No rebound or guarding.  Mild right CVA tenderness. Other:  Mild erythema in the right groin most  consistent with a very slight Candida infection.   ED Results / Procedures / Treatments   RADIOLOGY  I have reviewed and interpreted the CT images I do not see any obvious kidney stone on my evaluation. Radiology is read the CT scan is negative.   MEDICATIONS ORDERED IN ED: Medications - No data to display   IMPRESSION / MDM / ASSESSMENT AND PLAN / ED COURSE  I reviewed the triage vital signs and the nursing notes.  Patient's presentation is most consistent with acute presentation with potential threat to life or bodily function.  Patient presents emergency department for right flank pain and some dysuria over the past 3 days.  Overall the patient appears well, does have some mild right CVA tenderness to palpation.  Lab work is reassuring with a normal CMP, normal lipase and a normal CBC including normal white blood cell count.  Patient's urinalysis does show some red cells but no obvious bacteria or white cells.  Given the patient's complaint of dysuria and flank pain I believe is reasonable to cover with antibiotic and we will send the urine off for culture.  We will also send a nystatin topical powder for the patient.  Discussed PCP and return precautions.  Patient agreeable to plan.  FINAL CLINICAL IMPRESSION(S) / ED DIAGNOSES   Right flank pain Dysuria Candida infection  Note:  This document was prepared using Dragon voice recognition software and may include unintentional dictation errors.   Minna Antis, MD 10/24/21 [phone]

## 2021-10-25 LAB — URINE CULTURE: Culture: NO GROWTH

## 2021-11-02 ENCOUNTER — Ambulatory Visit
Admission: EM | Admit: 2021-11-02 | Discharge: 2021-11-02 | Disposition: A | Discharge status: Home / Self Care | Payer: 59 | Attending: Emergency Medicine | Admitting: Emergency Medicine

## 2021-11-02 ENCOUNTER — Encounter: Payer: Self-pay | Admitting: Emergency Medicine

## 2021-11-02 DIAGNOSIS — B86 Scabies: Secondary | ICD-10-CM | POA: Diagnosis not present

## 2021-11-02 MED ORDER — PERMETHRIN 5 % EX CREA: TOPICAL_CREAM | CUTANEOUS | 1 refills | Status: AC

## 2021-11-02 NOTE — ED Provider Notes (Signed)
[facility]    CSN: [id number] Arrival date & time: 11/02/21  1744      History   Chief Complaint Chief Complaint  Patient presents with   Rash    HPI Hannah Shah is a 51 y.o. female.   HPI  51 year old female here for evaluation of skin rash.  Patient reports that she has had a rash on her right lower back for the past 4 days.  She states that it burns and itches.  She reports initially it was like a bite that she scratched and then it became worse and spread around to her side.  Past Medical History:  Diagnosis Date   COPD (chronic obstructive pulmonary disease) ([place])    DDD (degenerative disc disease), cervical    H. pylori infection    Spinal stenosis     Patient Active Problem List   Diagnosis Date Noted   Viral URI with cough 03/17/2021   Flu 06/15/2016    Past Surgical History:  Procedure Laterality Date   TUBAL LIGATION  1994   TUBAL LIGATION      OB History   No obstetric history on file.      Home Medications    Prior to Admission medications   Medication Sig Start Date End Date Taking? Authorizing Provider  permethrin (ELIMITE) 5 % cream Apply to affected area once 11/02/21  Yes Margarette Canada, NP  albuterol (PROVENTIL HFA;VENTOLIN HFA) 108 (90 Base) MCG/ACT inhaler Inhale 2 puffs into the lungs every 4 (four) hours as needed for wheezing or shortness of breath.    [provider]  dicyclomine (BENTYL) 20 MG tablet Take 1 tablet (20 mg total) by mouth every 8 (eight) hours as needed for spasms (Abdominal cramping). 05/29/21   Ward, Delice Bison, DO  docusate sodium (COLACE) 100 MG capsule Take 1 tablet once or twice daily as needed for constipation while taking narcotic pain medicine 09/24/21   Hinda Kehr, MD  doxycycline (VIBRA-TABS) 100 MG tablet Take 1 tablet (100 mg total) by mouth 2 (two) times daily. 06/28/21   Cuthriell, Charline Bills, PA-C  Fluticasone-Salmeterol (ADVAIR) 250-50 MCG/DOSE AEPB Inhale 1 puff into the lungs  daily.    [provider]  meclizine (ANTIVERT) 25 MG tablet Take 1 tablet (25 mg total) by mouth 3 (three) times daily as needed for dizziness. 06/28/21   Cuthriell, Charline Bills, PA-C  nystatin (MYCOSTATIN/NYSTOP) powder Apply 1 Application topically 3 (three) times daily. 10/24/21   Harvest Dark, MD  ondansetron (ZOFRAN-ODT) 4 MG disintegrating tablet Take 1 tablet (4 mg total) by mouth every 8 (eight) hours as needed for nausea or vomiting. 06/28/21   Cuthriell, Charline Bills, PA-C  oxyCODONE-acetaminophen (PERCOCET) 5-325 MG tablet Take 2 tablets by mouth every 8 (eight) hours as needed for severe pain. 09/24/21   Hinda Kehr, MD  promethazine-dextromethorphan (PROMETHAZINE-DM) 6.25-15 MG/5ML syrup Take 5 mLs by mouth 4 (four) times daily as needed for cough. 0000000   Defelice, Jeanett Schlein, NP  tiotropium (SPIRIVA) 18 MCG inhalation capsule Place 18 mcg into inhaler and inhale daily.    [provider]    Family History Family History  Problem Relation Age of Onset   Diabetes Mother    Heart failure Mother    Heart attack Father     Social History Social History   Tobacco Use   Smoking status: Every Day    Packs/day: 0.50    Years: 30.00    Total pack years: 15.00    Types:  Cigarettes   Smokeless tobacco: Never  Vaping Use   Vaping Use: Former  Substance Use Topics   Alcohol use: No   Drug use: No     Allergies   Penicillins   Review of Systems Review of Systems  Skin:  Positive for color change and rash.     Physical Exam Triage Vital Signs ED Triage Vitals  Enc Vitals Group     BP 11/02/21 1812 126/66     Pulse Rate 11/02/21 1812 76     Resp 11/02/21 1812 18     Temp 11/02/21 1812 98.7 F (37.1 C)     Temp Source 11/02/21 1812 Oral     SpO2 11/02/21 1812 99 %     Weight --      Height --      Head Circumference --      Peak Flow --      Pain Score 11/02/21 1810 8     Pain Loc --      Pain Edu? --      Excl. in GC? --    No data  found.  Updated Vital Signs BP 126/66 (BP Location: Right Arm)   Pulse 76   Temp 98.7 F (37.1 C) (Oral)   Resp 18   SpO2 99%   Visual Acuity Right Eye Distance:   Left Eye Distance:   Bilateral Distance:    Right Eye Near:   Left Eye Near:    Bilateral Near:     Physical Exam Vitals and nursing note reviewed.  Constitutional:      Appearance: Normal appearance. She is not ill-appearing.  HENT:     Head: Normocephalic and atraumatic.  Skin:    General: Skin is warm and dry.     Capillary Refill: Capillary refill takes less than 2 seconds.     Findings: Erythema and rash present.  Neurological:     General: No focal deficit present.     Mental Status: She is alert and oriented to person, place, and time.  Psychiatric:        Mood and Affect: Mood normal.        Behavior: Behavior normal.        Thought Content: Thought content normal.        Judgment: Judgment normal.         UC Treatments / Results  Labs (all labs ordered are listed, but only abnormal results are displayed) Labs Reviewed - No data to display  EKG   Radiology No results found.  Procedures Procedures (including critical care time)  Medications Ordered in UC Medications - No data to display  Initial Impression / Assessment and Plan / UC Course  I have reviewed the triage vital signs and the nursing notes.  Pertinent labs & imaging results that were available during my care of the patient were reviewed by me and considered in my medical decision making (see chart for details).  Patient is a very pleasant, nontoxic-appearing 51 year old female here for evaluation of a burning itching skin rash on her back that is predominantly on the right side and is been present for the past 4 days.  She states that the rash initially started out as a small area that resembled a bite that she scratched and then this has spread.  The rash consists of erythematous maculopapular lesions, some of them are  scabbed over from where she has been scratching.  There is no vesicles noted.  These cover 2 separate  dermatomes on the right side and there are 3 lesions in a linear formation across the midline of the body and up to dermatome levels on the left.  Patient exam most closely resembles scabies.  I will treat her with topical permethrin cream.  I have advised her to apply the cream from neck to feet and leave on for 8 to 14 hours and then wash off.  She can repeat in 1 week as needed.   Final Clinical Impressions(s) / UC Diagnoses   Final diagnoses:  Scabies     Discharge Instructions      Apply the Elimite cream from neck to feet. Leave of for 8-14 hours and then wash off.  You can repeat in 1 week if your symptoms return.     ED Prescriptions     Medication Sig Dispense Auth. Provider   permethrin (ELIMITE) 5 % cream Apply to affected area once 60 g Becky Augusta, NP      PDMP not reviewed this encounter.   Becky Augusta, NP 11/02/21 Paulo Fruit

## 2021-11-02 NOTE — Discharge Instructions (Signed)
Apply the Elimite cream from neck to feet. Leave of for 8-14 hours and then wash off.  You can repeat in 1 week if your symptoms return.

## 2021-11-02 NOTE — ED Triage Notes (Signed)
Pt has rash on right lower back x 4 days that itches and burns. Reports cant stand clothing to touch it.

## 2022-03-12 ENCOUNTER — Encounter: Payer: Self-pay | Admitting: Emergency Medicine

## 2022-03-12 ENCOUNTER — Ambulatory Visit
Admission: EM | Admit: 2022-03-12 | Discharge: 2022-03-12 | Disposition: A | Discharge status: Home / Self Care | Payer: BLUE CROSS/BLUE SHIELD | Attending: Physician Assistant | Admitting: Physician Assistant

## 2022-03-12 DIAGNOSIS — J441 Chronic obstructive pulmonary disease with (acute) exacerbation: Secondary | ICD-10-CM | POA: Diagnosis present

## 2022-03-12 DIAGNOSIS — Z1152 Encounter for screening for COVID-19: Secondary | ICD-10-CM | POA: Insufficient documentation

## 2022-03-12 DIAGNOSIS — Z79899 Other long term (current) drug therapy: Secondary | ICD-10-CM | POA: Diagnosis not present

## 2022-03-12 DIAGNOSIS — Z792 Long term (current) use of antibiotics: Secondary | ICD-10-CM | POA: Insufficient documentation

## 2022-03-12 DIAGNOSIS — R051 Acute cough: Secondary | ICD-10-CM | POA: Insufficient documentation

## 2022-03-12 DIAGNOSIS — H6123 Impacted cerumen, bilateral: Secondary | ICD-10-CM | POA: Diagnosis not present

## 2022-03-12 DIAGNOSIS — Z7952 Long term (current) use of systemic steroids: Secondary | ICD-10-CM | POA: Diagnosis not present

## 2022-03-12 DIAGNOSIS — H9201 Otalgia, right ear: Secondary | ICD-10-CM

## 2022-03-12 LAB — RESP PANEL BY RT-PCR (FLU A&B, COVID) ARPGX2
Influenza A by PCR: NEGATIVE
Influenza B by PCR: NEGATIVE
SARS Coronavirus 2 by RT PCR: NEGATIVE

## 2022-03-12 MED ORDER — ALBUTEROL SULFATE HFA 108 (90 BASE) MCG/ACT IN AERS: 1.0000 | INHALATION_SPRAY | Freq: Four times a day (QID) | RESPIRATORY_TRACT | 0 refills | Status: AC | PRN

## 2022-03-12 MED ORDER — PREDNISONE 20 MG PO TABS: 40.0000 mg | ORAL_TABLET | Freq: Every day | ORAL | 0 refills | Status: AC

## 2022-03-12 MED ORDER — AZITHROMYCIN 250 MG PO TABS: 250.0000 mg | ORAL_TABLET | Freq: Every day | ORAL | 0 refills | Status: DC

## 2022-03-12 MED ORDER — PROMETHAZINE-DM 6.25-15 MG/5ML PO SYRP
5.0000 mL | ORAL_SOLUTION | Freq: Four times a day (QID) | ORAL | 0 refills | Status: DC | PRN
Start: 2022-03-12 — End: 2022-05-25

## 2022-03-12 NOTE — Discharge Instructions (Signed)
-  You are negative for flu and COVID.  Your granddaughter tested positive for RSV and it is possible you could have RSV as well.  There is no real treatment for that other than treating the symptoms.  Since you have COPD and to cover you for possible exacerbation at this time.  I have sent prednisone to the pharmacy as well as a cough medication and an antibiotic.  Plenty rest and fluids.  Return if you have any uncontrolled fever, weakness or breathing trouble not relieved by use severe inhalers and these medications.

## 2022-03-12 NOTE — ED Provider Notes (Signed)
[facility]    CSN: [id number] Arrival date & time: 03/12/22  0905      History   Chief Complaint Chief Complaint  Patient presents with   Otalgia   Nasal Congestion   Cough    HPI Hannah Shah is a 51 y.o. female presenting for approximately 3-day history of nasal congestion, cough, right-sided ear pain, sinus pressure and fatigue.  Denies fever.  Patient has history of COPD.  She reports slight increased shortness of breath from baseline but nothing significant.  Her grandchild is sick with similar symptoms and her daughter is sick as well recently.  She has been taking over-the-counter cough medication for symptoms.  She has no other complaints or concerns at this time.    HPI  Past Medical History:  Diagnosis Date   COPD (chronic obstructive pulmonary disease) ([facility])    DDD (degenerative disc disease), cervical    H. pylori infection    Spinal stenosis     Patient Active Problem List   Diagnosis Date Noted   Viral URI with cough 03/17/2021   Flu 06/15/2016    Past Surgical History:  Procedure Laterality Date   TUBAL LIGATION  1994   TUBAL LIGATION      OB History   No obstetric history on file.      Home Medications    Prior to Admission medications   Medication Sig Start Date End Date Taking? Authorizing Provider  albuterol (PROVENTIL HFA;VENTOLIN HFA) 108 (90 Base) MCG/ACT inhaler Inhale 2 puffs into the lungs every 4 (four) hours as needed for wheezing or shortness of breath.   Yes [provider]  azithromycin (ZITHROMAX) 250 MG tablet Take 1 tablet (250 mg total) by mouth daily. Take first 2 tablets together, then 1 every day until finished. 03/12/22  Yes Shirlee Latch, PA-C  Fluticasone-Salmeterol (ADVAIR) 250-50 MCG/DOSE AEPB Inhale 1 puff into the lungs daily.   Yes [provider]  predniSONE (DELTASONE) 20 MG tablet Take 2 tablets (40 mg total) by mouth daily for 5 days. 03/12/22 03/17/22 Yes Shirlee Latch,  PA-C  promethazine-dextromethorphan (PROMETHAZINE-DM) 6.25-15 MG/5ML syrup Take 5 mLs by mouth 4 (four) times daily as needed. 03/12/22  Yes Shirlee Latch, PA-C  dicyclomine (BENTYL) 20 MG tablet Take 1 tablet (20 mg total) by mouth every 8 (eight) hours as needed for spasms (Abdominal cramping). 05/29/21   Ward, Layla Maw, DO  docusate sodium (COLACE) 100 MG capsule Take 1 tablet once or twice daily as needed for constipation while taking narcotic pain medicine 09/24/21   Loleta Rose, MD  meclizine (ANTIVERT) 25 MG tablet Take 1 tablet (25 mg total) by mouth 3 (three) times daily as needed for dizziness. 06/28/21   Cuthriell, Delorise Royals, PA-C  nystatin (MYCOSTATIN/NYSTOP) powder Apply 1 Application topically 3 (three) times daily. 10/24/21   Minna Antis, MD  ondansetron (ZOFRAN-ODT) 4 MG disintegrating tablet Take 1 tablet (4 mg total) by mouth every 8 (eight) hours as needed for nausea or vomiting. 06/28/21   Cuthriell, Delorise Royals, PA-C  permethrin (ELIMITE) 5 % cream Apply to affected area once 11/02/21   Becky Augusta, NP  tiotropium (SPIRIVA) 18 MCG inhalation capsule Place 18 mcg into inhaler and inhale daily.    [provider]    Family History Family History  Problem Relation Age of Onset   Diabetes Mother    Heart failure Mother    Heart attack Father     Social History Social History  Tobacco Use   Smoking status: Every Day    Packs/day: 0.50    Years: 30.00    Total pack years: 15.00    Types: Cigarettes   Smokeless tobacco: Never  Vaping Use   Vaping Use: Former  Substance Use Topics   Alcohol use: No   Drug use: No     Allergies   Penicillins   Review of Systems Review of Systems  Constitutional:  Positive for fatigue. Negative for chills, diaphoresis and fever.  HENT:  Positive for congestion, ear pain, rhinorrhea and sinus pressure. Negative for sinus pain and sore throat.   Respiratory:  Positive for cough and shortness of breath (slight from  baseline).   Gastrointestinal:  Negative for abdominal pain, nausea and vomiting.  Musculoskeletal:  Negative for arthralgias and myalgias.  Skin:  Negative for rash.  Neurological:  Negative for weakness and headaches.  Hematological:  Negative for adenopathy.     Physical Exam Triage Vital Signs ED Triage Vitals  Enc Vitals Group     BP      Pulse      Resp      Temp      Temp src      SpO2      Weight      Height      Head Circumference      Peak Flow      Pain Score      Pain Loc      Pain Edu?      Excl. in GC?    No data found.  Updated Vital Signs BP (!) 115/90 (BP Location: Left Arm)   Pulse 84   Temp 97.9 F (36.6 C) (Oral)   Resp 14   Ht 5\' 5"  (1.651 m)   Wt 195 lb (88.5 kg)   SpO2 99%   BMI 32.45 kg/m   Physical Exam Vitals and nursing note reviewed.  Constitutional:      General: She is not in acute distress.    Appearance: Normal appearance. She is not ill-appearing or toxic-appearing.  HENT:     Head: Normocephalic and atraumatic.     Right Ear: There is impacted cerumen.     Left Ear: There is impacted cerumen.     Nose: Congestion present.     Mouth/Throat:     Mouth: Mucous membranes are moist.     Pharynx: Oropharynx is clear.  Eyes:     General: No scleral icterus.       Right eye: No discharge.        Left eye: No discharge.     Conjunctiva/sclera: Conjunctivae normal.  Cardiovascular:     Rate and Rhythm: Normal rate and regular rhythm.     Heart sounds: Normal heart sounds.  Pulmonary:     Effort: Pulmonary effort is normal. No respiratory distress.     Breath sounds: Normal breath sounds. No wheezing, rhonchi or rales.  Musculoskeletal:     Cervical back: Neck supple.  Skin:    General: Skin is dry.  Neurological:     General: No focal deficit present.     Mental Status: She is alert. Mental status is at baseline.     Motor: No weakness.     Gait: Gait normal.  Psychiatric:        Mood and Affect: Mood normal.         Behavior: Behavior normal.        Thought Content: Thought content normal.  UC Treatments / Results  Labs (all labs ordered are listed, but only abnormal results are displayed) Labs Reviewed  RESP PANEL BY RT-PCR (FLU A&B, COVID) [id number]    EKG   Radiology No results found.  Procedures Procedures (including critical care time)  Medications Ordered in UC Medications - No data to display  Initial Impression / Assessment and Plan / UC Course  I have reviewed the triage vital signs and the nursing notes.  Pertinent labs & imaging results that were available during my care of the patient were reviewed by me and considered in my medical decision making (see chart for details).   51 year old female presents for right ear pain, cough, congestion, sinus pressure and fatigue x 3 days.  Her daughter and granddaughter have had similar symptoms.  She has a history of COPD and reports slight increase shortness of breath from baseline and states that her mucus production is "lime green" in coloration.  She is afebrile and overall well-appearing.  On exam she has impacted cerumen of bilateral TMs, obstructing view of TMs as well as nasal congestion.  Throat is clear.  Chest clear to auscultation heart regular rate and rhythm.  Otic lavage to be performed by nursing staff and then I will reevaluate her ears to see if there is any evidence of infection.  Pending respiratory panel.  After otic lavage, reexamined ears and right TM is slightly erythematous but not bulging.  Slight bleeding in the EAC likely abrasion related to the otic lavage.  Negative respiratory panel.  Suspect COPD exacerbation.  Treating with azithromycin, prednisone and Promethazine DM.  Refilled her albuterol inhaler.  Reviewed return and ER precautions.  Final Clinical Impressions(s) / UC Diagnoses   Final diagnoses:  COPD exacerbation ([facility])  Acute cough  Right ear pain  Bilateral impacted cerumen      Discharge Instructions      -You are negative for flu and COVID.  Your granddaughter tested positive for RSV and it is possible you could have RSV as well.  There is no real treatment for that other than treating the symptoms.  Since you have COPD and to cover you for possible exacerbation at this time.  I have sent prednisone to the pharmacy as well as a cough medication and an antibiotic.  Plenty rest and fluids.  Return if you have any uncontrolled fever, weakness or breathing trouble not relieved by use severe inhalers and these medications.     ED Prescriptions     Medication Sig Dispense Auth. Provider   azithromycin (ZITHROMAX) 250 MG tablet Take 1 tablet (250 mg total) by mouth daily. Take first 2 tablets together, then 1 every day until finished. 6 tablet Eusebio Friendly B, PA-C   predniSONE (DELTASONE) 20 MG tablet Take 2 tablets (40 mg total) by mouth daily for 5 days. 10 tablet Shirlee Latch, PA-C   promethazine-dextromethorphan (PROMETHAZINE-DM) 6.25-15 MG/5ML syrup Take 5 mLs by mouth 4 (four) times daily as needed. 118 mL Shirlee Latch, PA-C      PDMP not reviewed this encounter.   Shirlee Latch, PA-C 03/12/22 1034

## 2022-03-12 NOTE — ED Triage Notes (Signed)
Patient c/o sinus drainage and pressure, right ear pain, nasal congestion and cough that started 3 days ago.  Patient denies fevers.

## 2022-05-25 ENCOUNTER — Encounter: Payer: Self-pay | Admitting: Emergency Medicine

## 2022-05-25 ENCOUNTER — Ambulatory Visit
Admission: EM | Admit: 2022-05-25 | Discharge: 2022-05-25 | Disposition: A | Discharge status: Home / Self Care | Payer: Medicaid Other | Attending: Physician Assistant | Admitting: Physician Assistant

## 2022-05-25 DIAGNOSIS — R1010 Upper abdominal pain, unspecified: Secondary | ICD-10-CM | POA: Diagnosis not present

## 2022-05-25 DIAGNOSIS — Z1152 Encounter for screening for COVID-19: Secondary | ICD-10-CM | POA: Diagnosis not present

## 2022-05-25 DIAGNOSIS — K529 Noninfective gastroenteritis and colitis, unspecified: Secondary | ICD-10-CM | POA: Diagnosis present

## 2022-05-25 DIAGNOSIS — R519 Headache, unspecified: Secondary | ICD-10-CM | POA: Diagnosis not present

## 2022-05-25 DIAGNOSIS — R112 Nausea with vomiting, unspecified: Secondary | ICD-10-CM | POA: Diagnosis present

## 2022-05-25 DIAGNOSIS — R197 Diarrhea, unspecified: Secondary | ICD-10-CM | POA: Diagnosis present

## 2022-05-25 LAB — RESP PANEL BY RT-PCR (RSV, FLU A&B, COVID)  RVPGX2
Influenza A by PCR: NEGATIVE
Influenza B by PCR: NEGATIVE
Resp Syncytial Virus by PCR: NEGATIVE
SARS Coronavirus 2 by RT PCR: NEGATIVE

## 2022-05-25 MED ORDER — ONDANSETRON 4 MG PO TBDP: 4.0000 mg | ORAL_TABLET | Freq: Three times a day (TID) | ORAL | 0 refills | Status: DC | PRN

## 2022-05-25 MED ORDER — ALUM & MAG HYDROXIDE-SIMETH 200-200-20 MG/5ML PO SUSP
30.0000 mL | Freq: Once | ORAL | Status: AC
  Administered 2022-05-25: 30 mL via ORAL

## 2022-05-25 MED ORDER — LIDOCAINE VISCOUS HCL 2 % MT SOLN
15.0000 mL | Freq: Once | OROMUCOSAL | Status: AC
  Administered 2022-05-25: 15 mL via OROMUCOSAL

## 2022-05-25 MED ORDER — KETOROLAC TROMETHAMINE 30 MG/ML IJ SOLN
30.0000 mg | Freq: Once | INTRAMUSCULAR | Status: AC
  Administered 2022-05-25: 30 mg via INTRAMUSCULAR

## 2022-05-25 MED ORDER — DICYCLOMINE HCL 20 MG PO TABS: 20.0000 mg | ORAL_TABLET | Freq: Three times a day (TID) | ORAL | 0 refills | Status: AC | PRN

## 2022-05-25 MED ORDER — ONDANSETRON 8 MG PO TBDP
8.0000 mg | ORAL_TABLET | Freq: Once | ORAL | Status: AC
  Administered 2022-05-25: 8 mg via ORAL

## 2022-05-25 NOTE — ED Provider Notes (Signed)
[facility]    CSN: [id number] Arrival date & time: 05/25/22  1542      History   Chief Complaint Chief Complaint  Patient presents with   Emesis   Diarrhea   Headache   Fatigue    HPI Hannah Shah is a 52 y.o. female presenting for onset of nausea/vomiting and diarrhea as well as abdominal cramping yesterday.  Reports she is also felt hot and cold.  States today she developed a headache, increased fatigue and congestion.  She says she started coughing a little when she came into the clinic today but has not been coughing before that.  She thinks it is because she is hot that she is coughing.  She denies recorded fever.  Denies sore throat, chest pain, shortness of breath.  Reports she has not really had any more diarrhea or vomiting today.  Reports that she ate some toast and drink some water and was able to hold that down today.  She has not taken any medication for symptoms.  She reports that her boss is sick at work with a sore throat but she denies any other sick contacts.  Her medical history significant for COPD.  No other complaints.  HPI  Past Medical History:  Diagnosis Date   COPD (chronic obstructive pulmonary disease) ([place])    DDD (degenerative disc disease), cervical    H. pylori infection    Spinal stenosis     Patient Active Problem List   Diagnosis Date Noted   Viral URI with cough 03/17/2021   Flu 06/15/2016    Past Surgical History:  Procedure Laterality Date   TUBAL LIGATION  1994   TUBAL LIGATION      OB History   No obstetric history on file.      Home Medications    Prior to Admission medications   Medication Sig Start Date End Date Taking? Authorizing Provider  albuterol (VENTOLIN HFA) 108 (90 Base) MCG/ACT inhaler Inhale 1-2 puffs into the lungs every 6 (six) hours as needed for wheezing or shortness of breath. 03/12/22   Danton Clap, PA-C  dicyclomine (BENTYL) 20 MG tablet Take 1 tablet (20 mg total) by mouth every  8 (eight) hours as needed for spasms (Abdominal cramping). 05/25/22   Danton Clap, PA-C  docusate sodium (COLACE) 100 MG capsule Take 1 tablet once or twice daily as needed for constipation while taking narcotic pain medicine 09/24/21   Hinda Kehr, MD  fluticasone [facility]) 50 MCG/ACT nasal spray Place into the nose.    [provider]  Fluticasone-Salmeterol (ADVAIR) 250-50 MCG/DOSE AEPB Inhale 1 puff into the lungs daily.    [provider]  meclizine (ANTIVERT) 25 MG tablet Take 1 tablet (25 mg total) by mouth 3 (three) times daily as needed for dizziness. 06/28/21   Cuthriell, Charline Bills, PA-C  nystatin (MYCOSTATIN/NYSTOP) powder Apply 1 Application topically 3 (three) times daily. 10/24/21   Harvest Dark, MD  ondansetron (ZOFRAN-ODT) 4 MG disintegrating tablet Take 1 tablet (4 mg total) by mouth every 8 (eight) hours as needed for nausea or vomiting. 05/25/22   Danton Clap, PA-C  permethrin (ELIMITE) 5 % cream Apply to affected area once 11/02/21   Margarette Canada, NP  tiotropium (SPIRIVA) 18 MCG inhalation capsule Place 18 mcg into inhaler and inhale daily.    [provider]    Family History Family History  Problem Relation Age of Onset   Diabetes Mother    Heart failure Mother  Heart attack Father     Social History Social History   Tobacco Use   Smoking status: Every Day    Packs/day: 0.50    Years: 30.00    Total pack years: 15.00    Types: Cigarettes   Smokeless tobacco: Never  Vaping Use   Vaping Use: Former  Substance Use Topics   Alcohol use: No   Drug use: No     Allergies   Penicillins   Review of Systems Review of Systems  Constitutional:  Positive for appetite change, chills, diaphoresis and fatigue. Negative for fever.  HENT:  Positive for congestion. Negative for ear pain, rhinorrhea and sore throat.   Respiratory:  Negative for cough and shortness of breath.   Cardiovascular:  Negative for chest pain.   Gastrointestinal:  Positive for abdominal pain, diarrhea, nausea and vomiting.  Genitourinary:  Negative for dysuria and flank pain.  Musculoskeletal:  Negative for arthralgias and myalgias.  Skin:  Negative for rash.  Neurological:  Negative for weakness and headaches.  Hematological:  Negative for adenopathy.     Physical Exam Triage Vital Signs ED Triage Vitals  Enc Vitals Group     BP 05/25/22 1610 103/70     Pulse Rate 05/25/22 1610 82     Resp 05/25/22 1610 16     Temp 05/25/22 1610 98 F (36.7 C)     Temp src --      SpO2 05/25/22 1610 96 %     Weight --      Height --      Head Circumference --      Peak Flow --      Pain Score 05/25/22 1607 6     Pain Loc --      Pain Edu? --      Excl. in [place]? --    No data found.  Updated Vital Signs BP 103/70 (BP Location: Right Arm)   Pulse 82   Temp 98 F (36.7 C)   Resp 16   SpO2 96%      Physical Exam Vitals and nursing note reviewed.  Constitutional:      General: She is not in acute distress.    Appearance: Normal appearance. She is ill-appearing. She is not toxic-appearing.  HENT:     Head: Normocephalic and atraumatic.     Nose: Congestion present.     Mouth/Throat:     Mouth: Mucous membranes are moist.     Pharynx: Oropharynx is clear.  Eyes:     General: No scleral icterus.       Right eye: No discharge.        Left eye: No discharge.     Conjunctiva/sclera: Conjunctivae normal.  Cardiovascular:     Rate and Rhythm: Normal rate and regular rhythm.     Heart sounds: Normal heart sounds.  Pulmonary:     Effort: Pulmonary effort is normal. No respiratory distress.     Breath sounds: Normal breath sounds.  Abdominal:     Palpations: Abdomen is soft.     Tenderness: There is abdominal tenderness (periumbilical, epigastric, RUQ, LUQ). There is no right CVA tenderness, left CVA tenderness or guarding.  Musculoskeletal:     Cervical back: Neck supple.  Skin:    General: Skin is dry.  Neurological:      General: No focal deficit present.     Mental Status: She is alert. Mental status is at baseline.     Motor: No weakness.  Gait: Gait normal.  Psychiatric:        Mood and Affect: Mood normal.        Behavior: Behavior normal.        Thought Content: Thought content normal.      UC Treatments / Results  Labs (all labs ordered are listed, but only abnormal results are displayed) Labs Reviewed  RESP PANEL BY RT-PCR (RSV, FLU A&B, COVID)  RVPGX2    EKG   Radiology No results found.  Procedures Procedures (including critical care time)  Medications Ordered in UC Medications  ketorolac (TORADOL) 30 MG/ML injection 30 mg (has no administration in time range)  alum & mag hydroxide-simeth (MAALOX/MYLANTA) 200-200-20 MG/5ML suspension 30 mL (30 mLs Oral Given 05/25/22 1721)  lidocaine (XYLOCAINE) 2 % viscous mouth solution 15 mL (15 mLs Mouth/Throat Given 05/25/22 1721)  ondansetron (ZOFRAN-ODT) disintegrating tablet 8 mg (8 mg Oral Given 05/25/22 1721)    Initial Impression / Assessment and Plan / UC Course  I have reviewed the triage vital signs and the nursing notes.  Pertinent labs & imaging results that were available during my care of the patient were reviewed by me and considered in my medical decision making (see chart for details).   52 year old female presents for abdominal cramping, nausea/vomiting/diarrhea, headache, congestion, feeling hot and cold since yesterday.  Vitals are normal and stable.  She is ill-appearing but nontoxic.  On exam she has slight congestion.  Throat is clear.  Chest clear to auscultation.  Tenderness palpation of the periumbilical, epigastric, right upper quadrant and left upper quadrant regions.  No guarding or rebound.  After palpating patient's abdomen she started to have belching and dry heaving.  Patient given GI cocktail and 8 mg ODT Zofran.  Respiratory panel obtained and all negative.  Clinical presentation consistent with  acute gastroenteritis.  She reports that she has not had any vomiting or diarrhea in 15 hours.  She says the vomiting and diarrhea lasted intermittently for 12 hours and ended at 2 AM this morning.  Reports that her abdominal pain has improved.  She reports continued nausea and fatigue.  Symptoms have overall improved.  Discussed further workup including lab work with patient but she has shown signs of improvement and has been able to hold down some food and fluids today.  Will treat patient outpatient at this time for suspected acute gastroenteritis due to viral cause.  Patient has been given 30 mg IM ketorolac in the clinic for abdominal cramping pain and headache.  Sent dicyclomine and Zofran ODT to pharmacy.  Encouraged her to increase her rest and fluids.  Discussed the importance of starting some Pedialyte to restore any also look like.  We discussed going to emergency department if she has fever or worsening abdominal pain or weakness.   Final Clinical Impressions(s) / UC Diagnoses   Final diagnoses:  Gastroenteritis  Nausea vomiting and diarrhea  Pain of upper abdomen  Acute nonintractable headache, unspecified headache type     Discharge Instructions      ABDOMINAL PAIN: You may take Tylenol for pain relief. Use medications as directed including antiemetics and antidiarrheal medications if suggested or prescribed. You should increase fluids and electrolytes as well as rest over these next several days. If you have any questions or concerns, or if your symptoms are not improving or if especially if they acutely worsen, please call or stop back to the clinic immediately and we will be happy to help you or go to the ER  ABDOMINAL PAIN RED FLAGS: Seek immediate further care if: symptoms remain the same or worsen over the next 3-7 days, you are unable to keep fluids down, you see blood or mucus in your stool, you vomit black or dark red material, you have a fever of 101.F or higher, you have  localized and/or persistent abdominal pain       ED Prescriptions     Medication Sig Dispense Auth. Provider   dicyclomine (BENTYL) 20 MG tablet Take 1 tablet (20 mg total) by mouth every 8 (eight) hours as needed for spasms (Abdominal cramping). 15 tablet Laurene Footman B, PA-C   ondansetron (ZOFRAN-ODT) 4 MG disintegrating tablet Take 1 tablet (4 mg total) by mouth every 8 (eight) hours as needed for nausea or vomiting. 15 tablet Gretta Cool      PDMP not reviewed this encounter.   Danton Clap, PA-C 05/25/22 1742

## 2022-05-25 NOTE — ED Triage Notes (Signed)
Pt states she developed vomiting and diarrhea that started yesterday until today. She has a headache, fever and fatigue.

## 2022-05-25 NOTE — Discharge Instructions (Addendum)

## 2022-07-21 ENCOUNTER — Ambulatory Visit
Admission: EM | Admit: 2022-07-21 | Discharge: 2022-07-21 | Disposition: A | Discharge status: Home / Self Care | Payer: Medicaid Other | Attending: Family Medicine | Admitting: Family Medicine

## 2022-07-21 DIAGNOSIS — J441 Chronic obstructive pulmonary disease with (acute) exacerbation: Secondary | ICD-10-CM

## 2022-07-21 LAB — GROUP A STREP BY PCR: Group A Strep by PCR: NOT DETECTED

## 2022-07-21 MED ORDER — AZITHROMYCIN 250 MG PO TABS: 250.0000 mg | ORAL_TABLET | Freq: Every day | ORAL | 0 refills | Status: AC

## 2022-07-21 MED ORDER — PREDNISONE 50 MG PO TABS: 50.0000 mg | ORAL_TABLET | Freq: Every day | ORAL | 0 refills | Status: AC

## 2022-07-21 MED ORDER — PROMETHAZINE-DM 6.25-15 MG/5ML PO SYRP: 5.0000 mL | ORAL_SOLUTION | Freq: Four times a day (QID) | ORAL | 0 refills | Status: AC | PRN

## 2022-07-21 NOTE — Discharge Instructions (Addendum)
You have a virus that is causing a COPD flare up.  Stop by the pharmacy to pick up your prescriptions.  Follow up with your primary care provider as needed.

## 2022-07-21 NOTE — ED Provider Notes (Addendum)
[facility]    CSN: [id number] Arrival date & time: 07/21/22  0815      History   Chief Complaint Chief Complaint  Patient presents with   Sore Throat   Cough   Facial Pressure    HPI Hannah Shah is a 52 y.o. female.   HPI   Andreka presents for cough, sinus pressure, sore throat, rhinorrhea and nasal congestion that started yesterday. The drainage gives her nausea. Has had some vomiting and diarrhea.  Took Zrytec, Flonase without relief. Last night, took Tylenol severe sinus which is helping with the drainage. Home test was negative yesterday.  Had a low-grade fever, "70s-80s".  She has COPD.     Past Medical History:  Diagnosis Date   COPD (chronic obstructive pulmonary disease)    DDD (degenerative disc disease), cervical    H. pylori infection    Spinal stenosis     Patient Active Problem List   Diagnosis Date Noted   Viral URI with cough 03/17/2021   Flu 06/15/2016    Past Surgical History:  Procedure Laterality Date   TUBAL LIGATION  1994   TUBAL LIGATION      OB History   No obstetric history on file.      Home Medications    Prior to Admission medications   Medication Sig Start Date End Date Taking? Authorizing Provider  albuterol (VENTOLIN HFA) 108 (90 Base) MCG/ACT inhaler Inhale 1-2 puffs into the lungs every 6 (six) hours as needed for wheezing or shortness of breath. 03/12/22  Yes Eusebio Friendly B, PA-C  azithromycin (ZITHROMAX Z-PAK) 250 MG tablet Take 1 tablet (250 mg total) by mouth daily. Take 2 tablets on day 1 07/21/22  Yes Airik Goodlin, DO  dicyclomine (BENTYL) 20 MG tablet Take 1 tablet (20 mg total) by mouth every 8 (eight) hours as needed for spasms (Abdominal cramping). 05/25/22  Yes Eusebio Friendly B, PA-C  docusate sodium (COLACE) 100 MG capsule Take 1 tablet once or twice daily as needed for constipation while taking narcotic pain medicine 09/24/21  Yes Loleta Rose, MD  fluticasone [facility]) 50 MCG/ACT nasal spray  Place into the nose.   Yes [provider]  meclizine (ANTIVERT) 25 MG tablet Take 1 tablet (25 mg total) by mouth 3 (three) times daily as needed for dizziness. 06/28/21  Yes Cuthriell, Delorise Royals, PA-C  nystatin (MYCOSTATIN/NYSTOP) powder Apply 1 Application topically 3 (three) times daily. 10/24/21  Yes Minna Antis, MD  ondansetron (ZOFRAN-ODT) 4 MG disintegrating tablet Take 1 tablet (4 mg total) by mouth every 8 (eight) hours as needed for nausea or vomiting. 05/25/22  Yes Shirlee Latch, PA-C  permethrin (ELIMITE) 5 % cream Apply to affected area once 11/02/21  Yes Becky Augusta, NP  predniSONE (DELTASONE) 50 MG tablet Take 1 tablet (50 mg total) by mouth daily for 5 days. 07/21/22 07/26/22 Yes Eliel Dudding, DO  promethazine-dextromethorphan (PROMETHAZINE-DM) 6.25-15 MG/5ML syrup Take 5 mLs by mouth 4 (four) times daily as needed. 07/21/22  Yes Esraa Seres, DO  Fluticasone-Salmeterol (ADVAIR) 250-50 MCG/DOSE AEPB Inhale 1 puff into the lungs daily.    [provider]  tiotropium (SPIRIVA) 18 MCG inhalation capsule Place 18 mcg into inhaler and inhale daily.    [provider]    Family History Family History  Problem Relation Age of Onset   Diabetes Mother    Heart failure Mother    Heart attack Father     Social History Social History   Tobacco  Use   Smoking status: Every Day    Packs/day: 0.50    Years: 30.00    Additional pack years: 0.00    Total pack years: 15.00    Types: Cigarettes   Smokeless tobacco: Never  Vaping Use   Vaping Use: Former  Substance Use Topics   Alcohol use: No   Drug use: No     Allergies   Penicillins   Review of Systems Review of Systems: negative unless otherwise stated in HPI.      Physical Exam Triage Vital Signs ED Triage Vitals  Enc Vitals Group     BP --      Pulse --      Resp 07/21/22 0823 16     Temp --      Temp Source 07/21/22 0823 Oral     SpO2 --      Weight 07/21/22 0823 210 lb  (95.3 kg)     Height 07/21/22 0823  (1.626 m)     Head Circumference --      Peak Flow --      Pain Score 07/21/22 0826 7     Pain Loc --      Pain Edu? --      Excl. in [place]? --    No data found.  Updated Vital Signs BP 132/65 (BP Location: Left Arm)   Pulse 85   Temp 98 F (36.7 C) (Oral)   Resp 16   Ht  (1.626 m)   Wt 95.3 kg   SpO2 95%   BMI 36.05 kg/m   Visual Acuity Right Eye Distance:   Left Eye Distance:   Bilateral Distance:    Right Eye Near:   Left Eye Near:    Bilateral Near:     Physical Exam GEN:     alert, ill but non-toxic appearing female in no distress    HENT:  mucus membranes moist, oropharyngeal without lesions or exudate, edentulous, no tonsillar hypertrophy, mild oropharyngeal erythema,  moderate erythematous edematous turbinates, clear nasal discharge EYES:   pupils equal and reactive, no scleral injection or discharge NECK:  normal ROM, no meningismus   RESP:  no increased work of breathing, frequent productive cough, expiratory wheezing  and diffuse rhonchi, good air movement  CVS:   regular rate and rhythm Skin:   warm and dry    UC Treatments / Results  Labs (all labs ordered are listed, but only abnormal results are displayed) Labs Reviewed  GROUP A STREP BY PCR    EKG   Radiology No results found.  Procedures Procedures (including critical care time)  Medications Ordered in UC Medications - No data to display  Initial Impression / Assessment and Plan / UC Course  I have reviewed the triage vital signs and the nursing notes.  Pertinent labs & imaging results that were available during my care of the patient were reviewed by me and considered in my medical decision making (see chart for details).       Pt is a 52 y.o. female with COPD who presents for cough that is not improving.  Tava is  afebrile here without recent antipyretics. Satting adequately on room air. Overall pt is  ill but non-toxic appearing,  well hydrated, without respiratory distress. Pulmonary exam is remarkable for expiratory wheezing nad rhonchi that clear with cough.  After shared decision making, we will not pursue chest x-ray at this time as it currently would not change management.  Strep PCR  is negative.  Home COVID testing was negative.   Treat acute COPD exacerbation due to viral URI with steroids and antibiotics as below.  Promethazine DM cough syrup given for cough and allow patient to rest.  Typical duration of symptoms discussed. Return and ED precautions given and patient voiced understanding. Work note provided.   Discussed MDM, treatment plan and plan for follow-up with patient who agrees with plan.     Final Clinical Impressions(s) / UC Diagnoses   Final diagnoses:  COPD exacerbation     Discharge Instructions      Stop by the pharmacy to pick up your prescriptions.  Follow up with your primary care provider as needed.      ED Prescriptions     Medication Sig Dispense Auth. Provider   azithromycin (ZITHROMAX Z-PAK) 250 MG tablet Take 1 tablet (250 mg total) by mouth daily. Take 2 tablets on day 1 6 tablet Palmer Shorey, DO   predniSONE (DELTASONE) 50 MG tablet Take 1 tablet (50 mg total) by mouth daily for 5 days. 5 tablet Levon Penning, DO   promethazine-dextromethorphan (PROMETHAZINE-DM) 6.25-15 MG/5ML syrup Take 5 mLs by mouth 4 (four) times daily as needed. 118 mL Katha Cabal, DO      PDMP not reviewed this encounter.       Katha Cabal, DO 07/21/22 1016

## 2022-07-21 NOTE — ED Triage Notes (Signed)
Pt c/o sore throat,cough & facial pressure x1 day. Denies any fevers.

## 2022-07-22 IMAGING — DX DG CHEST 1V PORT
1 series · 1 of 1 positions shown · non-contrast
Comparison: 07/08/2017

CLINICAL DATA: Cough, fever

EXAM:
PORTABLE CHEST 1 VIEW

[chest ap]
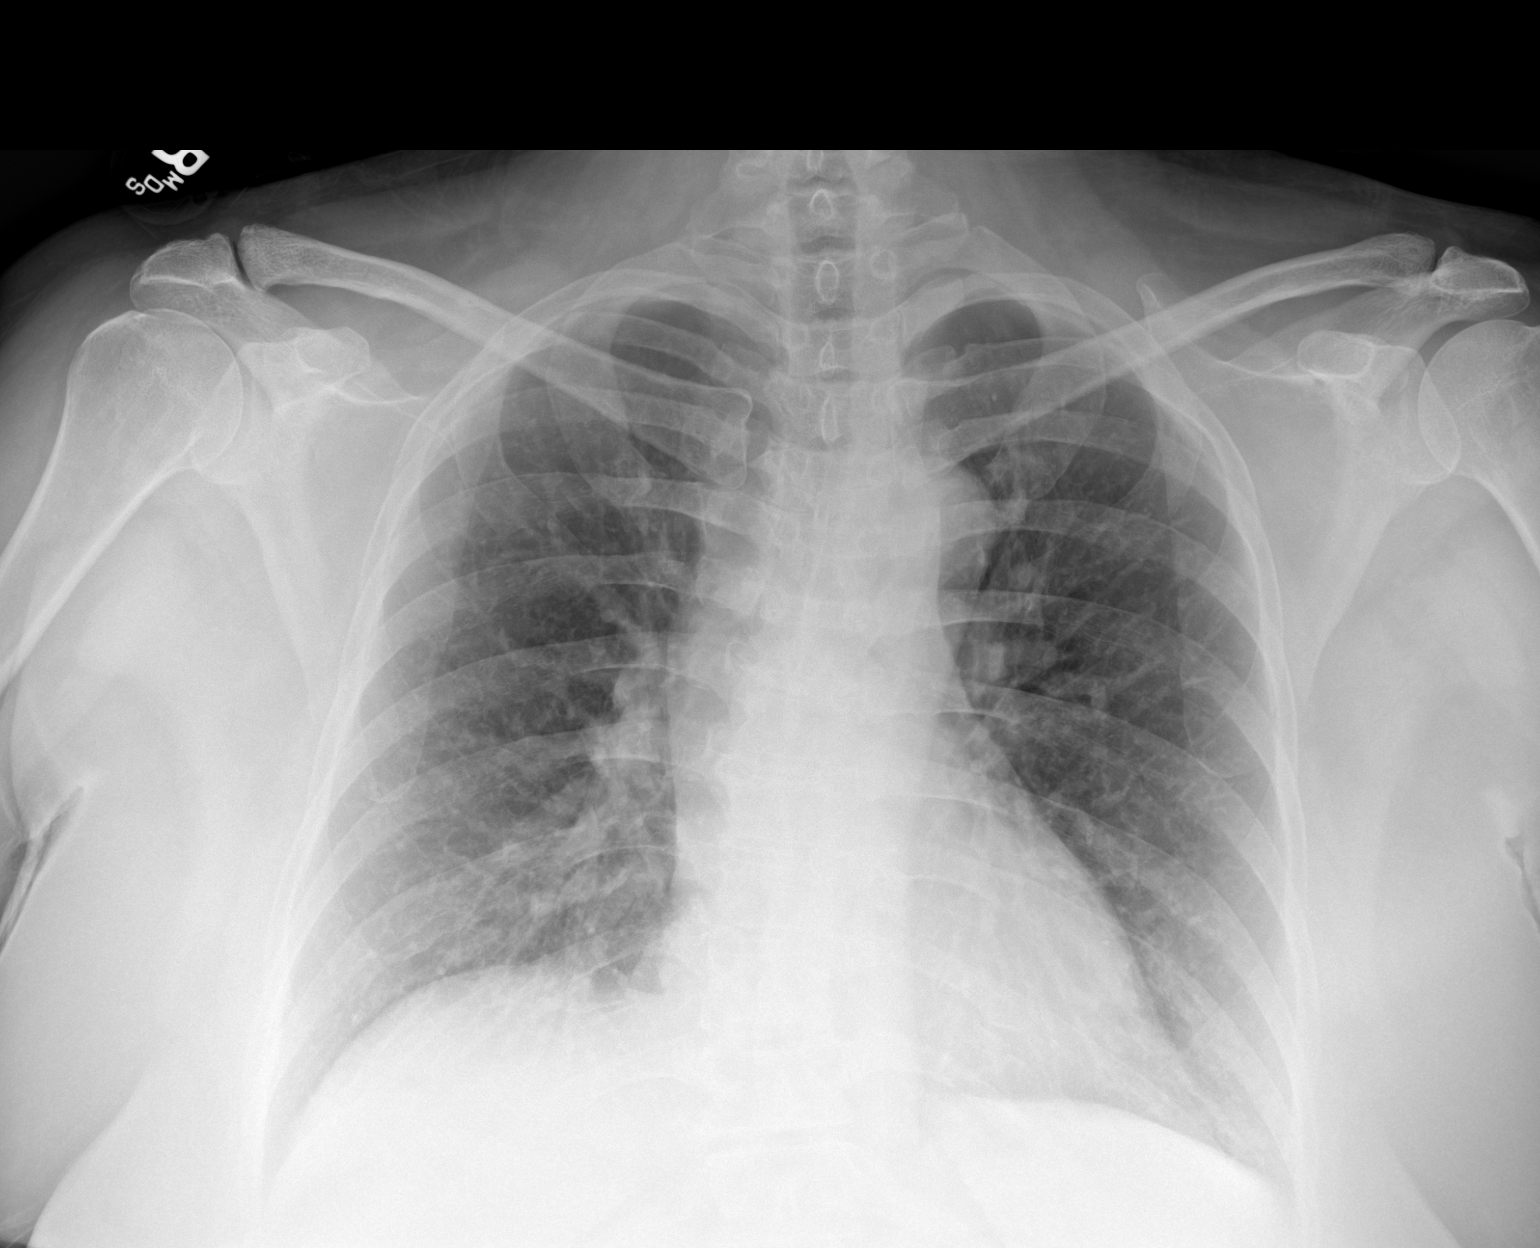

[1 of 1 positions shown; findings below may reference images not displayed]

FINDINGS: The heart size and mediastinal contours are within normal limits.
There is mild, diffuse interstitial opacity, similar in appearance
to prior examination. There is no new or focal airspace opacity. The
visualized skeletal structures are unremarkable.
IMPRESSION: Mild, diffuse interstitial opacity, similar in appearance to prior
examination and may reflect mild edema and/or chronic interstitial
change. No new or focal airspace opacity.

## 2023-05-16 ENCOUNTER — Emergency Department: Payer: 59

## 2023-05-16 ENCOUNTER — Other Ambulatory Visit: Payer: Self-pay

## 2023-05-16 ENCOUNTER — Emergency Department
Admission: EM | Admit: 2023-05-16 | Discharge: 2023-05-17 | Disposition: A | Discharge status: Home / Self Care | Payer: 59 | Attending: Emergency Medicine | Admitting: Emergency Medicine

## 2023-05-16 DIAGNOSIS — K760 Fatty (change of) liver, not elsewhere classified: Secondary | ICD-10-CM

## 2023-05-16 DIAGNOSIS — R1011 Right upper quadrant pain: Secondary | ICD-10-CM | POA: Diagnosis present

## 2023-05-16 DIAGNOSIS — Z20822 Contact with and (suspected) exposure to covid-19: Secondary | ICD-10-CM | POA: Insufficient documentation

## 2023-05-16 DIAGNOSIS — J101 Influenza due to other identified influenza virus with other respiratory manifestations: Secondary | ICD-10-CM | POA: Diagnosis not present

## 2023-05-16 DIAGNOSIS — J111 Influenza due to unidentified influenza virus with other respiratory manifestations: Secondary | ICD-10-CM

## 2023-05-16 LAB — COMPREHENSIVE METABOLIC PANEL
ALT: 57 U/L — ABNORMAL HIGH (ref 0–44)
AST: 43 U/L — ABNORMAL HIGH (ref 15–41)
Albumin: 4 g/dL (ref 3.5–5.0)
Alkaline Phosphatase: 36 U/L — ABNORMAL LOW (ref 38–126)
Anion gap: 7 (ref 5–15)
BUN: 16 mg/dL (ref 6–20)
CO2: 22 mmol/L (ref 22–32)
Calcium: 9 mg/dL (ref 8.9–10.3)
Chloride: 105 mmol/L (ref 98–111)
Creatinine, Ser: 0.99 mg/dL (ref 0.44–1.00)
GFR, Estimated: >60 mL/min (ref >60)
Glucose, Bld: 96 mg/dL (ref 70–99)
Potassium: 3.8 mmol/L (ref 3.5–5.1)
Sodium: 134 mmol/L — ABNORMAL LOW (ref 135–145)
Total Bilirubin: 0.6 mg/dL (ref 0.0–1.2)
Total Protein: 6.8 g/dL (ref 6.5–8.1)

## 2023-05-16 LAB — RESP PANEL BY RT-PCR (RSV, FLU A&B, COVID)  RVPGX2
Influenza A by PCR: POSITIVE — AB
Influenza B by PCR: NEGATIVE
Resp Syncytial Virus by PCR: NEGATIVE
SARS Coronavirus 2 by RT PCR: NEGATIVE

## 2023-05-16 LAB — CBC
HCT: 39 % (ref 36.0–46.0)
Hemoglobin: 13.4 g/dL (ref 12.0–15.0)
MCH: 32.1 pg (ref 26.0–34.0)
MCHC: 34.4 g/dL (ref 30.0–36.0)
MCV: 93.3 fL (ref 80.0–100.0)
Platelets: 224 10*3/uL (ref 150–400)
RBC: 4.18 MIL/uL (ref 3.87–5.11)
RDW: 12.6 % (ref 11.5–15.5)
WBC: 6.7 10*3/uL (ref 4.0–10.5)
nRBC: 0 % (ref 0.0–0.2)

## 2023-05-16 LAB — LIPASE, BLOOD: Lipase: 26 U/L (ref 11–51)

## 2023-05-16 LAB — TROPONIN I (HIGH SENSITIVITY): Troponin I (High Sensitivity): 8 ng/L (ref <18)

## 2023-05-16 MED ORDER — PREDNISONE 20 MG PO TABS: 40.0000 mg | ORAL_TABLET | Freq: Every day | ORAL | 0 refills | Status: AC

## 2023-05-16 MED ORDER — BENZONATATE 100 MG PO CAPS: 100.0000 mg | ORAL_CAPSULE | Freq: Three times a day (TID) | ORAL | 0 refills | Status: AC | PRN

## 2023-05-16 MED ORDER — ONDANSETRON 4 MG PO TBDP: 4.0000 mg | ORAL_TABLET | Freq: Three times a day (TID) | ORAL | 0 refills | Status: AC | PRN

## 2023-05-16 MED ORDER — ACETAMINOPHEN 500 MG PO TABS
1000.0000 mg | ORAL_TABLET | Freq: Once | ORAL | Status: AC
  Administered 2023-05-16: 1000 mg via ORAL
  Filled 2023-05-16: qty 2

## 2023-05-16 MED ORDER — OSELTAMIVIR PHOSPHATE 75 MG PO CAPS: 75.0000 mg | ORAL_CAPSULE | Freq: Two times a day (BID) | ORAL | 0 refills | Status: AC

## 2023-05-16 MED ORDER — LIDOCAINE 5 % EX PTCH: 1.0000 | MEDICATED_PATCH | Freq: Two times a day (BID) | CUTANEOUS | 0 refills | Status: AC

## 2023-05-16 MED ORDER — ONDANSETRON HCL 4 MG/2ML IJ SOLN
4.0000 mg | Freq: Once | INTRAMUSCULAR | Status: AC
  Administered 2023-05-16: 4 mg via INTRAVENOUS
  Filled 2023-05-16: qty 2

## 2023-05-16 MED ORDER — GUAIFENESIN-CODEINE 100-10 MG/5ML PO SOLN: 5.0000 mL | Freq: Three times a day (TID) | ORAL | 0 refills | Status: AC | PRN

## 2023-05-16 MED ORDER — KETOROLAC TROMETHAMINE 15 MG/ML IJ SOLN
15.0000 mg | Freq: Once | INTRAMUSCULAR | Status: AC
  Administered 2023-05-16: 15 mg via INTRAVENOUS
  Filled 2023-05-16: qty 1

## 2023-05-16 MED ORDER — BENZONATATE 100 MG PO CAPS
100.0000 mg | ORAL_CAPSULE | Freq: Once | ORAL | Status: AC
  Administered 2023-05-16: 100 mg via ORAL
  Filled 2023-05-16: qty 1

## 2023-05-16 MED ORDER — SODIUM CHLORIDE 0.9 % IV BOLUS
1000.0000 mL | Freq: Once | INTRAVENOUS | Status: AC
  Administered 2023-05-16: 1000 mL via INTRAVENOUS

## 2023-05-16 NOTE — ED Provider Notes (Signed)
[facility] Provider Note    Event Date/Time   First MD Initiated Contact with Patient 05/16/23 1929     (approximate)   History   Abdominal Pain   HPI  Hannah Shah is a 53 y.o. female who comes in with concerns for abdominal pain, nausea, fevers since last night.  Patient reports significant right upper quadrant pain as well.  However most of her pain is more like muscle aches.  She denies having her gallbladder removed.  She denies any chest pain.  She does report coughing and pain with coughing however.   Physical Exam   Triage Vital Signs: ED Triage Vitals  Encounter Vitals Group     BP 05/16/23 1750 110/62     Systolic BP Percentile --      Diastolic BP Percentile --      Pulse Rate 05/16/23 1750 96     Resp 05/16/23 1750 19     Temp 05/16/23 1750 98.9 F (37.2 C)     Temp Source 05/16/23 1750 Oral     SpO2 05/16/23 1750 92 %     Weight 05/16/23 1751 205 lb (93 kg)     Height 05/16/23 1751 5\' 5"  (1.651 m)     Head Circumference --      Peak Flow --      Pain Score 05/16/23 1747 6     Pain Loc --      Pain Education --      Exclude from Growth Chart --     Most recent vital signs: Vitals:   05/16/23 1750  BP: 110/62  Pulse: 96  Resp: 19  Temp: 98.9 F (37.2 C)  SpO2: 92%     General: Awake, no distress.  CV:  Good peripheral perfusion.  Resp:  Normal effort.  Abd:  No distention.  Slight tenderness on the right upper abdomen without any rebound or guarding Other:     ED Results / Procedures / Treatments   Labs (all labs ordered are listed, but only abnormal results are displayed) Labs Reviewed  RESP PANEL BY RT-PCR (RSV, FLU A&B, COVID)  RVPGX2 - Abnormal; Notable for the following components:      Result Value   Influenza A by PCR POSITIVE (*)    All other components within normal limits  COMPREHENSIVE METABOLIC PANEL - Abnormal; Notable for the following components:   Sodium 134 (*)    AST 43 (*)    ALT 57  (*)    Alkaline Phosphatase 36 (*)    All other components within normal limits  LIPASE, BLOOD  CBC  URINALYSIS, ROUTINE W REFLEX MICROSCOPIC     EKG  My interpretation of EKG:  Normal sinus rate of 78 without any ST elevation or T wave inversions, normal intervals  RADIOLOGY I have reviewed the xray personally and interpreted and no evidence of pneumonia  PROCEDURES:  Critical Care performed: No  Procedures   MEDICATIONS ORDERED IN ED: Medications  sodium chloride 0.9 % bolus 1,000 mL (1,000 mLs Intravenous New Bag/Given 05/16/23 2001)  ketorolac (TORADOL) 15 MG/ML injection 15 mg (15 mg Intravenous Given 05/16/23 2002)  acetaminophen (TYLENOL) tablet 1,000 mg (1,000 mg Oral Given 05/16/23 2022)  benzonatate (TESSALON) capsule 100 mg (100 mg Oral Given 05/16/23 2022)  ondansetron (ZOFRAN) injection 4 mg (4 mg Intravenous Given 05/16/23 2001)     IMPRESSION / MDM / ASSESSMENT AND PLAN / ED COURSE  I reviewed the triage vital signs and  the nursing notes.   Patient's presentation is most consistent with acute presentation with potential threat to life or bodily function.   Differential COVID, flu, RSV, pneumonia.  Given some right upper quadrant tenderness I will get ultrasound to rule out gallstones.  No lower abdominal pain to suggest appendicitis  Patient is flu positive.  Lipase normal CMP shows slight elevation of LFTs, CBC reassuring  IMPRESSION: Bronchitis/reactive airways.  9:26 PM patient reevaluated reports feeling better still some right upper quadrant pain but no lower abdominal tenderness.  Will get ultrasound to further evaluate.  Patient has no wheezing on examination.  Does report smoking history.  Will get ambulatory sat-ambulatory sat was normal.  Will give her some prednisone for a few days to help with any of the inflammation of the lungs.  Patient denies any urinary symptoms and patient urinated without giving a sample.  Do not feel like we need to get  another sample here today.  I did discuss with patient the dark stools that was charted in the triage note and patient states that is not black it is more like a greenish color.  She states that she gets this when she is sick.  I recommended doing a rectal exam to rule out any black tarry stools and patient declined stating this is what she typically gets when she is sick.  Considered admission for patient but given patient symptoms are better and patient is tolerating p.o. if ultrasound is reassuring patient can be discharged home given amatory sat was normal.  Patient did want to proceed with Tamiflu.   FINAL CLINICAL IMPRESSION(S) / ED DIAGNOSES   Final diagnoses:  Flu     Rx / DC Orders   ED Discharge Orders          Ordered    benzonatate (TESSALON PERLES) 100 MG capsule  3 times daily PRN        05/16/23 2235    guaiFENesin-codeine 100-10 MG/5ML syrup  3 times daily PRN        05/16/23 2235    ondansetron (ZOFRAN-ODT) 4 MG disintegrating tablet  Every 8 hours PRN        05/16/23 2235    lidocaine (LIDODERM) 5 %  Every 12 hours        05/16/23 2235    predniSONE (DELTASONE) 20 MG tablet  Daily with breakfast        05/16/23 2238    oseltamivir (TAMIFLU) 75 MG capsule  2 times daily        05/16/23 2326             Note:  This document was prepared using Dragon voice recognition software and may include unintentional dictation errors.   Concha Se, MD 05/16/23 [phone]

## 2023-05-16 NOTE — ED Notes (Signed)
Pt's amb pulse ox was 97% on RA.

## 2023-05-16 NOTE — ED Triage Notes (Signed)
Pt comes via EMS  from home with c/o fever since last night. Pt states dark stools yesterday morning.  Pt not able to eat or drink. Pt states belly pain that is right sided to shoulder.  120/79 96% RA CBG 128 98.2 temp 20 right hand Pt given 4 zofran.  Pt states burping started hour ago.

## 2023-05-16 NOTE — Discharge Instructions (Addendum)
Take Tylenol 1 g every 8 hours with ibuprofen 600 every 8 hours with food to help with pain.  Return to er for worsneing symptoms or any other concerns.

## 2023-05-17 NOTE — ED Provider Notes (Signed)
US ABDOMEN LIMITED RUQ (LIVER/GB) Result Date: 05/16/2023 CLINICAL DATA:  Right upper quadrant pain EXAM: ULTRASOUND ABDOMEN LIMITED RIGHT UPPER QUADRANT COMPARISON:  10/24/2021 FINDINGS: Gallbladder: No gallstones or wall thickening visualized. No sonographic Murphy sign noted by sonographer. Common bile duct: Diameter: Normal caliber, 4 mm Liver: Heterogeneous appearance with mildly increased echotexture suggesting fatty infiltration. No focal abnormality. Portal vein is patent on color Doppler imaging with normal direction of blood flow towards the liver. Other: None. IMPRESSION: No acute findings. Suspect mild fatty infiltration of the liver. Electronically Signed   By: Charlett Nose M.D.   On: 05/16/2023 23:23   DG Chest 2 View Result Date: 05/16/2023 CLINICAL DATA:  Cough and fever EXAM: CHEST - 2 VIEW COMPARISON:  05/10/2020 FINDINGS: Perihilar and peribronchial thickening. No focal consolidation, pneumothorax or pleural effusion. Normal cardiomediastinal silhouette. No acute bone abnormality. IMPRESSION: Bronchitis/reactive airways. Electronically Signed   By: Minerva Fester M.D.   On: 05/16/2023 20:52      ----------------------------------------- 12:54 AM on 05/17/2023 ----------------------------------------- Patient resting comfortably without distress.  Very mild transaminitis, possibly due to underlying fatty liver disease or perhaps associated acute viral illness.  Currently asymptomatic resting comfortably normal vital signs.  Discussed careful return precautions treatment recommendations and prescriptions that have previously been submitted for her for treatment of influenza.  He does not currently have a primary care, understand she can follow-up here at any time if symptoms worsen or concerns, I have also placed a referral to our primary care service which the patient is aware of   Return precautions and treatment recommendations and follow-up discussed with the patient who is  agreeable with the plan.    Sharyn Creamer, MD 05/17/23 [phone]

## 2023-12-09 ENCOUNTER — Emergency Department

## 2023-12-09 ENCOUNTER — Other Ambulatory Visit: Payer: Self-pay

## 2023-12-09 ENCOUNTER — Ambulatory Visit: Payer: Self-pay | Admitting: Student

## 2023-12-09 ENCOUNTER — Emergency Department
Admission: EM | Admit: 2023-12-09 | Discharge: 2023-12-09 | Disposition: A | Discharge status: Home / Self Care | Attending: Emergency Medicine | Admitting: Emergency Medicine

## 2023-12-09 DIAGNOSIS — R8289 Other abnormal findings on cytological and histological examination of urine: Secondary | ICD-10-CM | POA: Insufficient documentation

## 2023-12-09 DIAGNOSIS — K760 Fatty (change of) liver, not elsewhere classified: Secondary | ICD-10-CM | POA: Insufficient documentation

## 2023-12-09 DIAGNOSIS — R109 Unspecified abdominal pain: Secondary | ICD-10-CM | POA: Diagnosis present

## 2023-12-09 DIAGNOSIS — R944 Abnormal results of kidney function studies: Secondary | ICD-10-CM | POA: Diagnosis not present

## 2023-12-09 DIAGNOSIS — J449 Chronic obstructive pulmonary disease, unspecified: Secondary | ICD-10-CM | POA: Diagnosis not present

## 2023-12-09 DIAGNOSIS — R1010 Upper abdominal pain, unspecified: Secondary | ICD-10-CM

## 2023-12-09 LAB — URINALYSIS, ROUTINE W REFLEX MICROSCOPIC
Bilirubin Urine: NEGATIVE
Glucose, UA: NEGATIVE mg/dL
Ketones, ur: NEGATIVE mg/dL
Leukocytes,Ua: NEGATIVE
Nitrite: NEGATIVE
Protein, ur: NEGATIVE mg/dL
Specific Gravity, Urine: 1.01 (ref 1.005–1.030)
pH: 7 (ref 5.0–8.0)

## 2023-12-09 LAB — CBC
HCT: 41 % (ref 36.0–46.0)
Hemoglobin: 13.4 g/dL (ref 12.0–15.0)
MCH: 31.3 pg (ref 26.0–34.0)
MCHC: 32.7 g/dL (ref 30.0–36.0)
MCV: 95.8 fL (ref 80.0–100.0)
Platelets: 259 K/uL (ref 150–400)
RBC: 4.28 MIL/uL (ref 3.87–5.11)
RDW: 12.9 % (ref 11.5–15.5)
WBC: 7 K/uL (ref 4.0–10.5)
nRBC: 0 % (ref 0.0–0.2)

## 2023-12-09 LAB — COMPREHENSIVE METABOLIC PANEL WITH GFR
ALT: 79 U/L — ABNORMAL HIGH (ref 0–44)
AST: 62 U/L — ABNORMAL HIGH (ref 15–41)
Albumin: 4.2 g/dL (ref 3.5–5.0)
Alkaline Phosphatase: 48 U/L (ref 38–126)
Anion gap: 8 (ref 5–15)
BUN: 18 mg/dL (ref 6–20)
CO2: 27 mmol/L (ref 22–32)
Calcium: 9.5 mg/dL (ref 8.9–10.3)
Chloride: 105 mmol/L (ref 98–111)
Creatinine, Ser: 1.07 mg/dL — ABNORMAL HIGH (ref 0.44–1.00)
GFR, Estimated: >60 mL/min (ref >60)
Glucose, Bld: 88 mg/dL (ref 70–99)
Potassium: 3.9 mmol/L (ref 3.5–5.1)
Sodium: 140 mmol/L (ref 135–145)
Total Bilirubin: 0.9 mg/dL (ref 0.0–1.2)
Total Protein: 7.4 g/dL (ref 6.5–8.1)

## 2023-12-09 LAB — RESP PANEL BY RT-PCR (RSV, FLU A&B, COVID)  RVPGX2
Influenza A by PCR: NEGATIVE
Influenza B by PCR: NEGATIVE
Resp Syncytial Virus by PCR: NEGATIVE
SARS Coronavirus 2 by RT PCR: NEGATIVE

## 2023-12-09 LAB — LIPASE, BLOOD: Lipase: 35 U/L (ref 11–51)

## 2023-12-09 MED ORDER — PANTOPRAZOLE SODIUM 20 MG PO TBEC: 20.0000 mg | DELAYED_RELEASE_TABLET | Freq: Every day | ORAL | 1 refills | Status: AC

## 2023-12-09 MED ORDER — IOHEXOL 300 MG/ML  SOLN
100.0000 mL | Freq: Once | INTRAMUSCULAR | Status: AC | PRN
  Administered 2023-12-09: 100 mL via INTRAVENOUS

## 2023-12-09 MED ORDER — SUCRALFATE 1 G PO TABS: 1.0000 g | ORAL_TABLET | Freq: Four times a day (QID) | ORAL | 1 refills | Status: AC

## 2023-12-09 NOTE — Telephone Encounter (Cosign Needed)
 Called to inform that respiratory panel was negative.

## 2023-12-09 NOTE — ED Provider Notes (Signed)
 [facility] Provider Note    Event Date/Time   First MD Initiated Contact with Patient 12/09/23 1727     (approximate)   History   Abdominal Pain   HPI  Hannah Shah is a 53 y.o. female with PMH of CPOD, DDD, spinal stenosis who presents for evaluation of left sided abdominal pain that radiates into her lower back ongoing for the past 2 months.  Patient states that the pain is intermittent and is sudden in onset.  She describes it as being sharp.  She also has some associated vomiting.  Patient states she had the vomiting before the pain began 2 months ago and the pain makes the vomiting worse.  She denies diarrhea, constipation and fevers.  She came in today because now she feels like food is getting stuck in her throat and the pain has continued.      Physical Exam   Triage Vital Signs: ED Triage Vitals  Encounter Vitals Group     BP 12/09/23 1544 (!) 143/73     Girls Systolic BP Percentile --      Girls Diastolic BP Percentile --      Boys Systolic BP Percentile --      Boys Diastolic BP Percentile --      Pulse Rate 12/09/23 1543 82     Resp 12/09/23 1543 18     Temp 12/09/23 1543 97.7 F (36.5 C)     Temp Source 12/09/23 1543 Oral     SpO2 12/09/23 1543 97 %     Weight 12/09/23 1543 210 lb (95.3 kg)     Height 12/09/23 1543 5' 4 (1.626 m)     Head Circumference --      Peak Flow --      Pain Score --      Pain Loc --      Pain Education --      Exclude from Growth Chart --     Most recent vital signs: Vitals:   12/09/23 1544 12/09/23 1951  BP: (!) 143/73 129/76  Pulse:  81  Resp:  20  Temp:  97.8 F (36.6 C)  SpO2:  97%   General: Awake, no distress.  CV:  Good peripheral perfusion.  RRR. Resp:  Normal effort.  CTAB. Abd:  No distention.  Soft, generalized tenderness to palpation throughout the abdomen with most severe across the upper abdomen.  Bilateral CVA tenderness. Other:     ED Results / Procedures / Treatments    Labs (all labs ordered are listed, but only abnormal results are displayed) Labs Reviewed  COMPREHENSIVE METABOLIC PANEL WITH GFR - Abnormal; Notable for the following components:      Result Value   Creatinine, Ser 1.07 (*)    AST 62 (*)    ALT 79 (*)    All other components within normal limits  URINALYSIS, ROUTINE W REFLEX MICROSCOPIC - Abnormal; Notable for the following components:   Color, Urine STRAW (*)    APPearance CLEAR (*)    Hgb urine dipstick SMALL (*)    Bacteria, UA RARE (*)    All other components within normal limits  LIPASE, BLOOD  CBC    RADIOLOGY  Right upper quadrant ultrasound obtained, interpreted the images as well as reviewed the radiologist report.  See ED course for interpretation.  PROCEDURES:  Critical Care performed: No  Procedures   MEDICATIONS ORDERED IN ED: Medications  iohexol  (OMNIPAQUE ) 300 MG/ML solution 100 mL (100 mLs  Intravenous Contrast Given 12/09/23 2005)     IMPRESSION / MDM / ASSESSMENT AND PLAN / ED COURSE  I reviewed the triage vital signs and the nursing notes.                             53 year old female presents for evaluation of abdominal pain.  Blood pressure is elevated otherwise vital signs are stable.  Patient NAD on exam.  Differential diagnosis includes, but is not limited to, biliary disease, pancreatitis, esophagitis, gastritis, diverticulitis, appendicitis esophageal spasm, esophageal narrowing.  Patient's presentation is most consistent with acute complicated illness / injury requiring diagnostic workup.  CBC is unremarkable.  Lipase within normal limits.  CMP shows very mildly elevated creatinine and mildly elevated AST and ALT.  Urinalysis shows small amount of hemoglobin with rare bacteria but no nitrites, leukocytes or WBCs.  Do not feel this is consistent with a UTI.  On exam patient is tender throughout the abdomen but is most tender across the upper abdomen.  Will start with a right upper quadrant  ultrasound to evaluate for biliary disease if negative will consider CT scan for other acute abdominal pathologies.  If imaging is negative will advise patient to follow-up with GI specialist and will prescribe acid reducing medications as I believe she has developed some esophagitis from the frequent vomiting that is causing her to have difficulty swallowing and a globus sensation.    Clinical Course as of 12/09/23 2047  [place] Dec 09, 2023  1909 US  Abdomen Limited RUQ (LIVER/GB) No evidence of gall stones or cholecystitis, hepatic steatosis. [LD]  1913 Results reviewed with the patient, low suspicion for acute abdominal abnormality given patient has had pain for 2 months but she would like to proceed with imaging. She was tender throughout the abdomen on exam so felt this was reasonable. [LD]  2034 CT ABDOMEN PELVIS W CONTRAST Negative for any acute findings, did show hepatic steatosis. [LD]  2035 Workup is overall reassuring.  Will advise patient to follow-up with primary care regarding the hepatic steatosis.  Will start patient on acid reducing medications including a PPI and antacid.  Advised her to follow-up with GI specialist. [LD]  2044 Patient requesting covid swab as her daughter just tested positive.  Will obtain swab and call patient with results.  Patient gave me permission to speak to her daughter regarding these. [LD]    Clinical Course User Index [LD] Cleaster Tinnie LABOR, PA-C     FINAL CLINICAL IMPRESSION(S) / ED DIAGNOSES   Final diagnoses:  Pain of upper abdomen  Hepatic steatosis     Rx / DC Orders   ED Discharge Orders          Ordered    pantoprazole  (PROTONIX ) 20 MG tablet  Daily        12/09/23 2045    sucralfate  (CARAFATE ) 1 g tablet  4 times daily        12/09/23 2045             Note:  This document was prepared using Dragon voice recognition software and may include unintentional dictation errors.   Cleaster Tinnie LABOR, PA-C 12/09/23 2047     Levander Slate, MD 12/13/23 [phone]

## 2023-12-09 NOTE — ED Triage Notes (Signed)
 Patient states left lower abdominal pain that radiates into lower back for several months.

## 2023-12-09 NOTE — Discharge Instructions (Addendum)
 Your blood work, CT scan and ultrasound were normal today aside from showing fatty liver disease.  I have attached information about this for you to review.  I believe that your abdominal pain is the result of acid reflux.  I sent some medication to help with this.  Please follow-up with a GI specialist whose information is attached.  Call to schedule.  I have also attached information for primary care providers.  Please call at your convenience to set up an appointment.

## 2024-02-06 ENCOUNTER — Other Ambulatory Visit: Payer: Self-pay

## 2024-02-06 ENCOUNTER — Encounter: Payer: Self-pay | Admitting: Emergency Medicine

## 2024-02-06 ENCOUNTER — Emergency Department
Admission: EM | Admit: 2024-02-06 | Discharge: 2024-02-06 | Disposition: A | Discharge status: Home / Self Care | Attending: Emergency Medicine | Admitting: Emergency Medicine

## 2024-02-06 ENCOUNTER — Emergency Department

## 2024-02-06 DIAGNOSIS — J449 Chronic obstructive pulmonary disease, unspecified: Secondary | ICD-10-CM | POA: Diagnosis not present

## 2024-02-06 DIAGNOSIS — S99922A Unspecified injury of left foot, initial encounter: Secondary | ICD-10-CM | POA: Diagnosis present

## 2024-02-06 DIAGNOSIS — W208XXA Other cause of strike by thrown, projected or falling object, initial encounter: Secondary | ICD-10-CM | POA: Insufficient documentation

## 2024-02-06 DIAGNOSIS — S9032XA Contusion of left foot, initial encounter: Secondary | ICD-10-CM | POA: Insufficient documentation

## 2024-02-06 NOTE — ED Triage Notes (Signed)
 Pt states she dropped a kitchen chair on her left foot on Sunday and it is swollen, bruised and she is having difficulty walking on it.

## 2024-02-06 NOTE — ED Provider Notes (Signed)
 [facility] Provider Note    Event Date/Time   First MD Initiated Contact with Patient 02/06/24 1931     (approximate)   History   Foot Injury   HPI  Hannah Shah is a 53 y.o. female with PMH of COPD, degenerative disc disease and spinal stenosis presents for evaluation of left foot pain.  Patient dropped a kitchen chair on it on "Sunday.  She reports ongoing pain, swelling and some bruising.  She does have pain when walking on it.  She is been taking Tylenol and ibuprofen as well as using ice and heat.      Physical Exam   Triage Vital Signs: ED Triage Vitals  Encounter Vitals Group     BP 02/06/24 1853 (!) 149/76     Girls Systolic BP Percentile --      Girls Diastolic BP Percentile --      Boys Systolic BP Percentile --      Boys Diastolic BP Percentile --      Pulse Rate 02/06/24 1853 91     Resp 02/06/24 1853 17     Temp 02/06/24 1853 98.1 F (36.7 C)     Temp Source 02/06/24 1853 Oral     SpO2 02/06/24 1853 98 %     Weight 02/06/24 1855 232 lb (105.2 kg)     Height 02/06/24 1855 5' 4 (1.626 m)     Head Circumference --      Peak Flow --      Pain Score 02/06/24 1857 8     Pain Loc --      Pain Education --      Exclude from Growth Chart --     Most recent vital signs: Vitals:   02/06/24 1853  BP: (!) 149/76  Pulse: 91  Resp: 17  Temp: 98.1 F (36.7 C)  SpO2: 98%    General: Awake, no distress.  CV:  Good peripheral perfusion.  Resp:  Normal effort.  Abd:  No distention.  Other:  Left ankle and foot mildly swollen, some bruising over the dorsum of the foot with corresponding tenderness to palpation, ankle ROM well maintained, and patient able to flex and extend the toes.    ED Results / Procedures / Treatments   Labs (all labs ordered are listed, but only abnormal results are displayed) Labs Reviewed - No data to display    RADIOLOGY  Left foot xray obtained, I interpreted the images as well as reviewed the  radiologist report which was negative for fractures but does show some soft tissue swelling.   PROCEDURES:  Critical Care performed: No  Procedures   MEDICATIONS ORDERED IN ED: Medications - No data to display   IMPRESSION / MDM / ASSESSMENT AND PLAN / ED COURSE  I reviewed the triage vital signs and the nursing notes.                             53"  year old female presents for evaluation of left foot injury.  Blood pressure is elevated, vital signs stable otherwise.  Patient mildly uncomfortable on exam.  Differential diagnosis includes, but is not limited to, fracture, strain, sprain, contusion.  Patient's presentation is most consistent with acute complicated illness / injury requiring diagnostic workup.  Left foot x-ray is negative for fractures but does show some soft tissue swelling.  Advised patient to continue with Tylenol , ibuprofen , icing, heat and elevating.  Will provide  follow-up information for podiatry advised her to schedule an appointment should her pain continue.  Patient did not need a note for work.  She voiced understanding, all questions were answered and she is stable at discharge.    FINAL CLINICAL IMPRESSION(S) / ED DIAGNOSES   Final diagnoses:  Contusion of left foot, initial encounter     Rx / DC Orders   ED Discharge Orders     None        Note:  This document was prepared using Dragon voice recognition software and may include unintentional dictation errors.   Cleaster Tinnie LABOR, PA-C 02/06/24 2024    Nicholaus Rolland BRAVO, MD 02/07/24 RED

## 2024-02-06 NOTE — Discharge Instructions (Addendum)
 The x-ray of your left foot did not show any fractures but does show some soft tissue swelling.  I believe your pain is due to a contusion. I have attached information about this for you to review.    You can take 650 mg of Tylenol  and 600 mg of ibuprofen  every 6 hours as needed for pain. You can use ice, heat, muscle creams and other topical pain relievers as well.  Schedule follow-up appointment with podiatry as information is attached if your pain continues.
